# Patient Record
Sex: Female | Born: 1945 | Race: White | Hispanic: No
Health system: Midwestern US, Academic
[De-identification: ages and names within clinical notes are randomized; demographics above are authoritative.]

---

## 2017-10-14 LAB — CBC: Lab: 9

## 2017-10-14 LAB — LIPID PROFILE
Lab: 196 — ABNORMAL HIGH (ref <100)
Lab: 28 — ABNORMAL HIGH (ref 0.57–1.11)
Lab: 296 — ABNORMAL HIGH (ref 150–200)
Lab: 4
Lab: 73 — ABNORMAL HIGH (ref 35–60)

## 2017-10-14 LAB — COMPREHENSIVE METABOLIC PANEL
Lab: 138
Lab: 7.1

## 2017-10-14 LAB — THYROID STIMULATING HORMONE-TSH: Lab: 1.3

## 2017-11-02 ENCOUNTER — Encounter: Admit: 2017-11-02 | Discharge: 2017-11-02 | Payer: MEDICARE

## 2017-11-02 DIAGNOSIS — I10 Essential (primary) hypertension: Principal | ICD-10-CM

## 2017-11-14 ENCOUNTER — Encounter: Admit: 2017-11-14 | Discharge: 2017-11-14 | Payer: MEDICARE

## 2017-12-15 ENCOUNTER — Encounter: Admit: 2017-12-15 | Discharge: 2017-12-15 | Payer: MEDICARE

## 2017-12-16 ENCOUNTER — Ambulatory Visit: Admit: 2017-12-16 | Discharge: 2017-12-17 | Payer: MEDICARE

## 2017-12-16 ENCOUNTER — Encounter: Admit: 2017-12-16 | Discharge: 2017-12-16 | Payer: MEDICARE

## 2017-12-16 DIAGNOSIS — R0600 Dyspnea, unspecified: Principal | ICD-10-CM

## 2017-12-27 ENCOUNTER — Encounter: Admit: 2017-12-27 | Discharge: 2017-12-27 | Payer: MEDICARE

## 2017-12-27 DIAGNOSIS — G8929 Other chronic pain: ICD-10-CM

## 2017-12-27 DIAGNOSIS — G43909 Migraine, unspecified, not intractable, without status migrainosus: ICD-10-CM

## 2017-12-27 DIAGNOSIS — M549 Dorsalgia, unspecified: ICD-10-CM

## 2017-12-27 DIAGNOSIS — Z9114 Patient's other noncompliance with medication regimen: ICD-10-CM

## 2017-12-27 DIAGNOSIS — F319 Bipolar disorder, unspecified: ICD-10-CM

## 2017-12-27 DIAGNOSIS — E785 Hyperlipidemia, unspecified: Principal | ICD-10-CM

## 2017-12-27 DIAGNOSIS — K219 Gastro-esophageal reflux disease without esophagitis: ICD-10-CM

## 2017-12-27 DIAGNOSIS — R0789 Other chest pain: ICD-10-CM

## 2017-12-27 DIAGNOSIS — I6529 Occlusion and stenosis of unspecified carotid artery: ICD-10-CM

## 2017-12-27 DIAGNOSIS — I10 Essential (primary) hypertension: ICD-10-CM

## 2017-12-27 DIAGNOSIS — G14 Postpolio syndrome: ICD-10-CM

## 2017-12-27 NOTE — Progress Notes
Records Request    Medical records request for continuation of care:    Patient has appointment on 12/30/17   with  Dr. Nickolas MadridMarina Hannen* .    Please fax records to [facility]  [phone]    Request records:    Most recent hospital admission/ Emergency Room visit    Cardiac Catheterization    EKG's           Recent Cardiac Testing    Any cardiac-related records    Recent Labs    Procedures    H&P/Discharge Summary    Operative Reports- Cardiac      Thank you,      [facility]  [facility]  [address]  [place], [place] [id number]  Phone:  [phone]  Fax:  [phone]

## 2017-12-30 ENCOUNTER — Encounter: Admit: 2017-12-30 | Discharge: 2017-12-30 | Payer: MEDICARE

## 2017-12-30 ENCOUNTER — Ambulatory Visit: Admit: 2017-12-30 | Discharge: 2017-12-31 | Payer: MEDICARE

## 2017-12-30 DIAGNOSIS — I10 Essential (primary) hypertension: ICD-10-CM

## 2017-12-30 DIAGNOSIS — I6529 Occlusion and stenosis of unspecified carotid artery: ICD-10-CM

## 2017-12-30 DIAGNOSIS — E7800 Pure hypercholesterolemia, unspecified: ICD-10-CM

## 2017-12-30 DIAGNOSIS — N289 Disorder of kidney and ureter, unspecified: ICD-10-CM

## 2017-12-30 DIAGNOSIS — K219 Gastro-esophageal reflux disease without esophagitis: ICD-10-CM

## 2017-12-30 DIAGNOSIS — M549 Dorsalgia, unspecified: ICD-10-CM

## 2017-12-30 DIAGNOSIS — Z9114 Patient's other noncompliance with medication regimen: ICD-10-CM

## 2017-12-30 DIAGNOSIS — I350 Nonrheumatic aortic (valve) stenosis: ICD-10-CM

## 2017-12-30 DIAGNOSIS — F319 Bipolar disorder, unspecified: ICD-10-CM

## 2017-12-30 DIAGNOSIS — E785 Hyperlipidemia, unspecified: Principal | ICD-10-CM

## 2017-12-30 DIAGNOSIS — G43909 Migraine, unspecified, not intractable, without status migrainosus: ICD-10-CM

## 2017-12-30 DIAGNOSIS — G8929 Other chronic pain: ICD-10-CM

## 2017-12-30 DIAGNOSIS — G14 Postpolio syndrome: ICD-10-CM

## 2017-12-30 DIAGNOSIS — R0989 Other specified symptoms and signs involving the circulatory and respiratory systems: ICD-10-CM

## 2017-12-30 DIAGNOSIS — R0789 Other chest pain: ICD-10-CM

## 2017-12-30 DIAGNOSIS — I6522 Occlusion and stenosis of left carotid artery: Principal | ICD-10-CM

## 2018-01-06 ENCOUNTER — Encounter: Admit: 2018-01-06 | Discharge: 2018-01-06 | Payer: MEDICARE

## 2018-01-06 ENCOUNTER — Encounter: Admit: 2018-01-06 | Discharge: 2018-01-07 | Payer: MEDICARE

## 2018-01-07 ENCOUNTER — Encounter: Admit: 2018-01-07 | Discharge: 2018-01-08 | Payer: MEDICARE

## 2018-02-10 ENCOUNTER — Encounter: Admit: 2018-02-10 | Discharge: 2018-02-11 | Payer: MEDICARE

## 2018-08-09 ENCOUNTER — Encounter: Admit: 2018-08-09 | Discharge: 2018-08-09 | Payer: MEDICARE

## 2018-11-03 ENCOUNTER — Encounter: Admit: 2018-11-03 | Discharge: 2018-11-03 | Payer: MEDICARE

## 2018-11-03 DIAGNOSIS — M549 Dorsalgia, unspecified: ICD-10-CM

## 2018-11-03 DIAGNOSIS — G8929 Other chronic pain: ICD-10-CM

## 2018-11-03 DIAGNOSIS — E785 Hyperlipidemia, unspecified: Principal | ICD-10-CM

## 2018-11-03 DIAGNOSIS — R269 Unspecified abnormalities of gait and mobility: ICD-10-CM

## 2018-11-03 DIAGNOSIS — R0789 Other chest pain: ICD-10-CM

## 2018-11-03 DIAGNOSIS — G14 Postpolio syndrome: ICD-10-CM

## 2018-11-03 DIAGNOSIS — I10 Essential (primary) hypertension: ICD-10-CM

## 2018-11-03 DIAGNOSIS — Z9114 Patient's other noncompliance with medication regimen: ICD-10-CM

## 2018-11-03 DIAGNOSIS — F319 Bipolar disorder, unspecified: ICD-10-CM

## 2018-11-03 DIAGNOSIS — I6529 Occlusion and stenosis of unspecified carotid artery: ICD-10-CM

## 2018-11-03 DIAGNOSIS — G43909 Migraine, unspecified, not intractable, without status migrainosus: ICD-10-CM

## 2018-11-03 DIAGNOSIS — K219 Gastro-esophageal reflux disease without esophagitis: ICD-10-CM

## 2018-11-04 ENCOUNTER — Emergency Department
Admit: 2018-11-04 | Discharge: 2018-11-04 | Payer: MEDICARE | Attending: Student in an Organized Health Care Education/Training Program

## 2018-11-04 ENCOUNTER — Encounter: Admit: 2018-11-04 | Discharge: 2018-11-04 | Payer: MEDICARE

## 2018-11-04 DIAGNOSIS — K219 Gastro-esophageal reflux disease without esophagitis: ICD-10-CM

## 2018-11-04 DIAGNOSIS — Z9114 Patient's other noncompliance with medication regimen: ICD-10-CM

## 2018-11-04 DIAGNOSIS — G8929 Other chronic pain: ICD-10-CM

## 2018-11-04 DIAGNOSIS — I6529 Occlusion and stenosis of unspecified carotid artery: ICD-10-CM

## 2018-11-04 DIAGNOSIS — M549 Dorsalgia, unspecified: ICD-10-CM

## 2018-11-04 DIAGNOSIS — I10 Essential (primary) hypertension: ICD-10-CM

## 2018-11-04 DIAGNOSIS — F319 Bipolar disorder, unspecified: ICD-10-CM

## 2018-11-04 DIAGNOSIS — R0789 Other chest pain: ICD-10-CM

## 2018-11-04 DIAGNOSIS — G14 Postpolio syndrome: ICD-10-CM

## 2018-11-04 DIAGNOSIS — G43909 Migraine, unspecified, not intractable, without status migrainosus: ICD-10-CM

## 2018-11-04 DIAGNOSIS — E785 Hyperlipidemia, unspecified: Principal | ICD-10-CM

## 2018-11-04 LAB — COMPREHENSIVE METABOLIC PANEL
Lab: 140 MMOL/L — ABNORMAL LOW (ref 137–147)
Lab: 15 10*3/uL — ABNORMAL HIGH (ref 3–12)
Lab: 38 mL/min — ABNORMAL LOW (ref >60)
Lab: 46 mL/min — ABNORMAL LOW (ref >60)

## 2018-11-04 LAB — URINALYSIS DIPSTICK
Lab: NEGATIVE U/L (ref 7–40)
Lab: NEGATIVE U/L (ref 7–56)
Lab: NEGATIVE g/dL (ref 3.5–5.0)
Lab: NEGATIVE g/dL (ref 6.0–8.0)
Lab: NEGATIVE mg/dL (ref 8.5–10.6)
Lab: NEGATIVE mg/dL — ABNORMAL LOW (ref 0.3–1.2)

## 2018-11-04 LAB — URINALYSIS, MICROSCOPIC

## 2018-11-04 LAB — CBC AND DIFF
Lab: 0 10*3/uL (ref 0–0.45)
Lab: 0.1 10*3/uL (ref 0–0.20)
Lab: 15 10*3/uL — ABNORMAL HIGH (ref 4.5–11.0)
Lab: 0 % (ref 0–2)
Lab: 0 % (ref 0–5)
Lab: 0 10*3/uL (ref 0–0.20)
Lab: 0 10*3/uL (ref 0–0.45)
Lab: 0.8 10*3/uL (ref 0–0.80)
Lab: 13 10*3/uL — ABNORMAL HIGH (ref 4.5–11.0)
Lab: 2.9 10*3/uL (ref 1.0–4.8)
Lab: 3.6 M/UL — ABNORMAL LOW (ref 4.0–5.0)
Lab: 9.3 10*3/uL — ABNORMAL HIGH (ref 1.8–7.0)

## 2018-11-04 LAB — VITAMIN B12: Lab: 190 pg/mL (ref 180–914)

## 2018-11-04 LAB — BASIC METABOLIC PANEL
Lab: 1.2 mg/dL — ABNORMAL HIGH (ref 0.4–1.00)
Lab: 107 MMOL/L (ref 98–110)
Lab: 125 mg/dL — ABNORMAL HIGH (ref 70–100)
Lab: 13 g/dL — ABNORMAL HIGH (ref 3–12)
Lab: 140 MMOL/L — ABNORMAL LOW (ref 137–147)
Lab: 20 MMOL/L — ABNORMAL LOW (ref 21–30)
Lab: 22 mg/dL (ref 7–25)
Lab: 3.2 MMOL/L — ABNORMAL LOW (ref 3.5–5.1)
Lab: 43 mL/min — ABNORMAL LOW (ref >60)
Lab: 52 mL/min — ABNORMAL LOW (ref >60)
Lab: 9.1 mg/dL (ref 8.5–10.6)

## 2018-11-04 LAB — IRON + BINDING CAPACITY + %SAT+ FERRITIN
Lab: 13 % — ABNORMAL LOW (ref 28–42)
Lab: 31 ng/mL (ref 10–200)
Lab: 413 ug/dL — ABNORMAL HIGH (ref 270–380)
Lab: 54 ug/dL (ref 50–160)

## 2018-11-04 LAB — PHOSPHORUS: Lab: 3 mg/dL (ref 2.0–4.5)

## 2018-11-04 LAB — POC TROPONIN

## 2018-11-04 LAB — MAGNESIUM: Lab: 2.3 mg/dL (ref 1.6–2.6)

## 2018-11-04 LAB — TSH WITH FREE T4 REFLEX: Lab: 0.8 uU/mL — ABNORMAL HIGH (ref 0.35–5.00)

## 2018-11-04 MED ORDER — ALPRAZOLAM 1 MG PO TAB
1 mg | Freq: Once | ORAL | 0 refills | Status: CP
Start: 2018-11-04 — End: ?
  Administered 2018-11-05: 07:00:00 1 mg via ORAL

## 2018-11-04 MED ORDER — RIVAROXABAN 20 MG PO TAB
20 mg | Freq: Every day | ORAL | 0 refills | Status: DC
Start: 2018-11-04 — End: 2018-11-06
  Administered 2018-11-04: 23:00:00 20 mg via ORAL

## 2018-11-04 MED ORDER — PANTOPRAZOLE 40 MG PO TBEC
40 mg | Freq: Every day | ORAL | 0 refills | Status: DC
Start: 2018-11-04 — End: 2018-11-06
  Administered 2018-11-04 – 2018-11-05 (×2): 40 mg via ORAL

## 2018-11-04 MED ORDER — ASPIRIN 81 MG PO CHEW
81 mg | Freq: Every day | ORAL | 0 refills | Status: DC
Start: 2018-11-04 — End: 2018-11-04

## 2018-11-04 MED ORDER — METOPROLOL SUCCINATE 100 MG PO TB24
100 mg | Freq: Every evening | ORAL | 0 refills | Status: DC
Start: 2018-11-04 — End: 2018-11-06

## 2018-11-04 MED ORDER — LAMOTRIGINE 200 MG PO TAB
200 mg | Freq: Every day | ORAL | 0 refills | Status: DC
Start: 2018-11-04 — End: 2018-11-06
  Administered 2018-11-04 – 2018-11-05 (×2): 200 mg via ORAL

## 2018-11-04 MED ORDER — RIVAROXABAN 20 MG PO TAB
20 mg | ORAL_TABLET | Freq: Every day | ORAL | 0 refills | Status: CN
Start: 2018-11-04 — End: ?

## 2018-11-04 MED ORDER — HYDRALAZINE 20 MG/ML IJ SOLN
10 mg | INTRAVENOUS | 0 refills | Status: DC | PRN
Start: 2018-11-04 — End: 2018-11-06
  Administered 2018-11-05 (×2): 10 mg via INTRAVENOUS

## 2018-11-04 MED ORDER — LAMOTRIGINE 200 MG PO TAB
200 mg | Freq: Every day | ORAL | 0 refills | Status: CN
Start: 2018-11-04 — End: ?

## 2018-11-04 MED ORDER — ROPINIROLE 0.5 MG PO TAB
3 mg | Freq: Every evening | ORAL | 0 refills | Status: DC
Start: 2018-11-04 — End: 2018-11-06
  Administered 2018-11-05 (×2): 3 mg via ORAL

## 2018-11-04 MED ORDER — OXYCODONE 5 MG PO TAB
5 mg | Freq: Two times a day (BID) | ORAL | 0 refills | Status: DC | PRN
Start: 2018-11-04 — End: 2018-11-05
  Administered 2018-11-04 – 2018-11-05 (×2): 5 mg via ORAL

## 2018-11-04 MED ORDER — IMS MIXTURE TEMPLATE
0.3 mg | Freq: Every evening | ORAL | 0 refills | Status: DC
Start: 2018-11-04 — End: 2018-11-05
  Administered 2018-11-05: 05:00:00 0.3 mg via ORAL

## 2018-11-04 MED ORDER — TRAMADOL 50 MG PO TAB
50-100 mg | Freq: Three times a day (TID) | ORAL | 0 refills | Status: DC | PRN
Start: 2018-11-04 — End: 2018-11-06
  Administered 2018-11-04 – 2018-11-05 (×2): 50 mg via ORAL

## 2018-11-04 MED ORDER — CARBAMAZEPINE 200 MG PO CM12
200 mg | Freq: Two times a day (BID) | ORAL | 0 refills | Status: DC
Start: 2018-11-04 — End: 2018-11-04

## 2018-11-04 MED ORDER — CEFTRIAXONE INJ 1GM IVP
1 g | Freq: Once | INTRAVENOUS | 0 refills | Status: CP
Start: 2018-11-04 — End: ?
  Administered 2018-11-04: 09:00:00 1 g via INTRAVENOUS

## 2018-11-04 MED ORDER — CITALOPRAM 10 MG PO TAB
40 mg | Freq: Every day | ORAL | 0 refills | Status: DC
Start: 2018-11-04 — End: 2018-11-06
  Administered 2018-11-04 – 2018-11-05 (×2): 40 mg via ORAL

## 2018-11-04 MED ORDER — ROSUVASTATIN 20 MG PO TAB
20 mg | Freq: Every evening | ORAL | 0 refills | Status: DC
Start: 2018-11-04 — End: 2018-11-06

## 2018-11-04 MED ORDER — ALPRAZOLAM 0.5 MG PO TAB
1 mg | Freq: Every evening | ORAL | 0 refills | Status: DC | PRN
Start: 2018-11-04 — End: 2018-11-06
  Administered 2018-11-05: 05:00:00 1 mg via ORAL

## 2018-11-04 MED ORDER — ONDANSETRON HCL (PF) 4 MG/2 ML IJ SOLN
4 mg | INTRAVENOUS | 0 refills | Status: DC | PRN
Start: 2018-11-04 — End: 2018-11-06
  Administered 2018-11-05 (×2): 4 mg via INTRAVENOUS

## 2018-11-04 MED ORDER — ACETAMINOPHEN 500 MG PO TAB
1000 mg | Freq: Three times a day (TID) | ORAL | 0 refills | Status: DC
Start: 2018-11-04 — End: 2018-11-06
  Administered 2018-11-04: 22:00:00 1000 mg via ORAL

## 2018-11-04 MED ORDER — LACTATED RINGERS IV SOLP
500 mL | INTRAVENOUS | 0 refills | Status: CP
Start: 2018-11-04 — End: ?
  Administered 2018-11-04: 09:00:00 500 mL via INTRAVENOUS

## 2018-11-04 MED ORDER — ROPINIROLE 2 MG PO TAB
2 mg | Freq: Once | ORAL | 0 refills | Status: CP
Start: 2018-11-04 — End: ?
  Administered 2018-11-04: 22:00:00 2 mg via ORAL

## 2018-11-04 MED ORDER — ACETAMINOPHEN 500 MG PO TAB
1000 mg | Freq: Three times a day (TID) | ORAL | 0 refills | Status: CN
Start: 2018-11-04 — End: ?

## 2018-11-04 MED ORDER — CEFTRIAXONE INJ 1GM IVP
1 g | INTRAVENOUS | 0 refills | Status: DC
Start: 2018-11-04 — End: 2018-11-06
  Administered 2018-11-05: 12:00:00 1 g via INTRAVENOUS

## 2018-11-05 ENCOUNTER — Inpatient Hospital Stay: Admit: 2018-11-05 | Discharge: 2018-11-06 | Payer: MEDICARE

## 2018-11-05 ENCOUNTER — Inpatient Hospital Stay: Admit: 2018-11-04 | Discharge: 2018-11-04 | Payer: MEDICARE

## 2018-11-05 ENCOUNTER — Encounter: Admit: 2018-11-05 | Discharge: 2018-11-05 | Payer: MEDICARE

## 2018-11-05 ENCOUNTER — Inpatient Hospital Stay
Admit: 2018-11-04 | Discharge: 2018-11-05 | Disposition: A | Discharge status: Home / Self Care | Payer: MEDICARE | Attending: Student in an Organized Health Care Education/Training Program

## 2018-11-05 DIAGNOSIS — Z96653 Presence of artificial knee joint, bilateral: ICD-10-CM

## 2018-11-05 DIAGNOSIS — N183 Chronic kidney disease, stage 3 (moderate): ICD-10-CM

## 2018-11-05 DIAGNOSIS — M1611 Unilateral primary osteoarthritis, right hip: ICD-10-CM

## 2018-11-05 DIAGNOSIS — K219 Gastro-esophageal reflux disease without esophagitis: ICD-10-CM

## 2018-11-05 DIAGNOSIS — I6522 Occlusion and stenosis of left carotid artery: ICD-10-CM

## 2018-11-05 DIAGNOSIS — E538 Deficiency of other specified B group vitamins: Principal | ICD-10-CM

## 2018-11-05 DIAGNOSIS — D509 Iron deficiency anemia, unspecified: ICD-10-CM

## 2018-11-05 DIAGNOSIS — S62501A Fracture of unspecified phalanx of right thumb, initial encounter for closed fracture: ICD-10-CM

## 2018-11-05 DIAGNOSIS — R296 Repeated falls: ICD-10-CM

## 2018-11-05 DIAGNOSIS — Z79899 Other long term (current) drug therapy: ICD-10-CM

## 2018-11-05 DIAGNOSIS — N179 Acute kidney failure, unspecified: ICD-10-CM

## 2018-11-05 DIAGNOSIS — Z90710 Acquired absence of both cervix and uterus: ICD-10-CM

## 2018-11-05 DIAGNOSIS — E785 Hyperlipidemia, unspecified: ICD-10-CM

## 2018-11-05 DIAGNOSIS — N3000 Acute cystitis without hematuria: ICD-10-CM

## 2018-11-05 DIAGNOSIS — Z823 Family history of stroke: ICD-10-CM

## 2018-11-05 DIAGNOSIS — Z86711 Personal history of pulmonary embolism: ICD-10-CM

## 2018-11-05 DIAGNOSIS — F3181 Bipolar II disorder: ICD-10-CM

## 2018-11-05 DIAGNOSIS — D649 Anemia, unspecified: ICD-10-CM

## 2018-11-05 DIAGNOSIS — G4700 Insomnia, unspecified: ICD-10-CM

## 2018-11-05 DIAGNOSIS — Z87891 Personal history of nicotine dependence: ICD-10-CM

## 2018-11-05 DIAGNOSIS — G43909 Migraine, unspecified, not intractable, without status migrainosus: ICD-10-CM

## 2018-11-05 DIAGNOSIS — E568 Deficiency of other vitamins: ICD-10-CM

## 2018-11-05 DIAGNOSIS — G2581 Restless legs syndrome: ICD-10-CM

## 2018-11-05 DIAGNOSIS — I2510 Atherosclerotic heart disease of native coronary artery without angina pectoris: ICD-10-CM

## 2018-11-05 DIAGNOSIS — I129 Hypertensive chronic kidney disease with stage 1 through stage 4 chronic kidney disease, or unspecified chronic kidney disease: ICD-10-CM

## 2018-11-05 LAB — CULTURE-URINE W/SENSITIVITY: Lab: 3

## 2018-11-05 LAB — HOMOCYSTEINE: Lab: 14 umol/L (ref 5–20)

## 2018-11-05 LAB — CBC: Lab: 10 K/UL — ABNORMAL LOW (ref >60)

## 2018-11-05 LAB — CREATINE KINASE-CPK: Lab: 22 U/L (ref 21–215)

## 2018-11-05 MED ORDER — CYANOCOBALAMIN (VITAMIN B-12) 100 MCG PO TAB
100 ug | ORAL_TABLET | Freq: Every day | ORAL | 0 refills | 29.00000 days | Status: AC
Start: 2018-11-05 — End: 2018-11-05

## 2018-11-05 MED ORDER — FERROUS SULFATE 325 MG (65 MG IRON) PO TAB
325 mg | ORAL_TABLET | Freq: Three times a day (TID) | ORAL | 0 refills | Status: AC
Start: 2018-11-05 — End: ?

## 2018-11-05 MED ORDER — CYANOCOBALAMIN (VITAMIN B-12) 1,000 MCG/ML IJ SOLN
1000 ug | Freq: Once | INTRAMUSCULAR | 0 refills | Status: CP
Start: 2018-11-05 — End: ?
  Administered 2018-11-05: 18:00:00 1000 ug via INTRAMUSCULAR

## 2018-11-05 MED ORDER — CLONIDINE HCL 0.1 MG PO TAB
.3 mg | Freq: Two times a day (BID) | ORAL | 0 refills | Status: DC
Start: 2018-11-05 — End: 2018-11-06
  Administered 2018-11-05: 22:00:00 0.3 mg via ORAL

## 2018-11-05 MED ORDER — CLONIDINE HCL 0.3 MG PO TAB
0.3 mg | ORAL_TABLET | Freq: Two times a day (BID) | ORAL | 0 refills | Status: AC
Start: 2018-11-05 — End: ?

## 2018-11-05 MED ORDER — SYRINGE WITH NEEDLE 3 ML 22 X 1 1/2" MISC SYRG
0 refills | 28.00000 days | Status: AC
Start: 2018-11-05 — End: ?

## 2018-11-05 MED ORDER — SODIUM CHLORIDE 0.9 % IJ SOLN
50 mL | Freq: Once | INTRAVENOUS | 0 refills | Status: CP
Start: 2018-11-05 — End: ?
  Administered 2018-11-05: 15:00:00 50 mL via INTRAVENOUS

## 2018-11-05 MED ORDER — RIVAROXABAN 20 MG PO TAB
20 mg | ORAL_TABLET | Freq: Every day | ORAL | 0 refills | 30.00000 days | Status: AC
Start: 2018-11-05 — End: ?

## 2018-11-05 MED ORDER — LAMOTRIGINE 200 MG PO TAB
200 mg | ORAL_TABLET | Freq: Every day | ORAL | 0 refills | Status: AC
Start: 2018-11-05 — End: ?

## 2018-11-05 MED ORDER — SUMATRIPTAN SUCCINATE 50 MG PO TAB
50 mg | Freq: Once | ORAL | 0 refills | Status: CP
Start: 2018-11-05 — End: ?
  Administered 2018-11-05: 16:00:00 50 mg via ORAL

## 2018-11-05 MED ORDER — PROMETHAZINE 25 MG/ML IJ SOLN
12.5 mg | Freq: Once | INTRAVENOUS | 0 refills | Status: CP
Start: 2018-11-05 — End: ?
  Administered 2018-11-05: 22:00:00 12.5 mg via INTRAVENOUS

## 2018-11-05 MED ORDER — CYANOCOBALAMIN (VITAMIN B-12) 1,000 MCG/ML IJ SOLN
1 mL | INTRAMUSCULAR | 0 refills | 29.00000 days | Status: AC
Start: 2018-11-05 — End: ?

## 2018-11-05 MED ORDER — FERROUS SULFATE 325 MG (65 MG IRON) PO TAB
325 mg | Freq: Three times a day (TID) | ORAL | 0 refills | Status: DC
Start: 2018-11-05 — End: 2018-11-06
  Administered 2018-11-05 (×2): 325 mg via ORAL

## 2018-11-05 MED ORDER — IOHEXOL 350 MG IODINE/ML IV SOLN
65 mL | Freq: Once | INTRAVENOUS | 0 refills | Status: CP
Start: 2018-11-05 — End: ?
  Administered 2018-11-05: 15:00:00 65 mL via INTRAVENOUS

## 2018-11-07 LAB — INTRINSIC FACTOR BLOCKING AB: Lab: POSITIVE

## 2018-11-08 LAB — METHYLMALONIC ACID QUANT: Lab: 0.3

## 2018-11-09 LAB — ANTI-PARIETAL CELL ANTIBODY: Lab: 140 — ABNORMAL HIGH

## 2018-11-20 ENCOUNTER — Encounter: Admit: 2018-11-20 | Discharge: 2018-11-21 | Payer: MEDICARE

## 2018-12-07 ENCOUNTER — Encounter: Admit: 2018-12-07 | Discharge: 2018-12-08 | Payer: MEDICARE

## 2018-12-07 ENCOUNTER — Encounter: Admit: 2018-12-07 | Discharge: 2018-12-07 | Payer: MEDICARE

## 2019-01-17 ENCOUNTER — Ambulatory Visit: Admit: 2019-01-17 | Discharge: 2019-01-18 | Payer: MEDICARE

## 2019-01-17 ENCOUNTER — Encounter: Admit: 2019-01-17 | Discharge: 2019-01-17 | Payer: MEDICARE

## 2019-01-17 DIAGNOSIS — Z9114 Patient's other noncompliance with medication regimen: Secondary | ICD-10-CM

## 2019-01-17 DIAGNOSIS — R0789 Other chest pain: Secondary | ICD-10-CM

## 2019-01-17 DIAGNOSIS — I6529 Occlusion and stenosis of unspecified carotid artery: Secondary | ICD-10-CM

## 2019-01-17 DIAGNOSIS — G8929 Other chronic pain: Secondary | ICD-10-CM

## 2019-01-17 DIAGNOSIS — G43909 Migraine, unspecified, not intractable, without status migrainosus: Secondary | ICD-10-CM

## 2019-01-17 DIAGNOSIS — K219 Gastro-esophageal reflux disease without esophagitis: Secondary | ICD-10-CM

## 2019-01-17 DIAGNOSIS — I10 Essential (primary) hypertension: Secondary | ICD-10-CM

## 2019-01-17 DIAGNOSIS — F319 Bipolar disorder, unspecified: Secondary | ICD-10-CM

## 2019-01-17 DIAGNOSIS — E785 Hyperlipidemia, unspecified: Secondary | ICD-10-CM

## 2019-01-17 DIAGNOSIS — M549 Dorsalgia, unspecified: Secondary | ICD-10-CM

## 2019-01-17 DIAGNOSIS — G14 Postpolio syndrome: Secondary | ICD-10-CM

## 2019-01-18 DIAGNOSIS — R296 Repeated falls: Secondary | ICD-10-CM

## 2019-01-18 DIAGNOSIS — E568 Deficiency of other vitamins: Secondary | ICD-10-CM

## 2019-02-01 ENCOUNTER — Encounter: Admit: 2019-02-01 | Discharge: 2019-02-01 | Payer: MEDICARE

## 2019-02-01 MED ORDER — CYANOCOBALAMIN (VITAMIN B-12) 1,000 MCG/ML IJ SOLN
0 refills
Start: 2019-02-01 — End: ?

## 2019-02-17 ENCOUNTER — Encounter: Admit: 2019-02-17 | Discharge: 2019-02-17 | Payer: MEDICARE

## 2019-02-23 ENCOUNTER — Encounter: Admit: 2019-02-23 | Discharge: 2019-02-23 | Payer: MEDICARE

## 2019-05-08 ENCOUNTER — Encounter: Admit: 2019-05-08 | Discharge: 2019-05-09 | Payer: MEDICARE

## 2019-06-20 ENCOUNTER — Encounter: Admit: 2019-06-20 | Discharge: 2019-06-20

## 2019-07-13 ENCOUNTER — Encounter: Admit: 2019-07-13 | Discharge: 2019-07-13

## 2019-07-14 ENCOUNTER — Encounter: Admit: 2019-07-14 | Discharge: 2019-07-14

## 2019-07-17 ENCOUNTER — Encounter: Admit: 2019-07-17 | Discharge: 2019-07-17

## 2019-07-18 ENCOUNTER — Encounter: Admit: 2019-07-18 | Discharge: 2019-07-18

## 2019-07-18 ENCOUNTER — Ambulatory Visit: Admit: 2019-07-18 | Discharge: 2019-07-18

## 2019-07-18 DIAGNOSIS — E538 Deficiency of other specified B group vitamins: Principal | ICD-10-CM

## 2019-07-18 DIAGNOSIS — F319 Bipolar disorder, unspecified: Secondary | ICD-10-CM

## 2019-07-18 DIAGNOSIS — K219 Gastro-esophageal reflux disease without esophagitis: Secondary | ICD-10-CM

## 2019-07-18 DIAGNOSIS — M549 Dorsalgia, unspecified: Secondary | ICD-10-CM

## 2019-07-18 DIAGNOSIS — I6529 Occlusion and stenosis of unspecified carotid artery: Secondary | ICD-10-CM

## 2019-07-18 DIAGNOSIS — R479 Unspecified speech disturbances: Secondary | ICD-10-CM

## 2019-07-18 DIAGNOSIS — I10 Essential (primary) hypertension: Secondary | ICD-10-CM

## 2019-07-18 DIAGNOSIS — Z9114 Patient's other noncompliance with medication regimen: Secondary | ICD-10-CM

## 2019-07-18 DIAGNOSIS — R413 Other amnesia: Secondary | ICD-10-CM

## 2019-07-18 DIAGNOSIS — G8929 Other chronic pain: Secondary | ICD-10-CM

## 2019-07-18 DIAGNOSIS — G43909 Migraine, unspecified, not intractable, without status migrainosus: Secondary | ICD-10-CM

## 2019-07-18 DIAGNOSIS — R0789 Other chest pain: Secondary | ICD-10-CM

## 2019-07-18 DIAGNOSIS — G14 Postpolio syndrome: Secondary | ICD-10-CM

## 2019-07-18 DIAGNOSIS — E785 Hyperlipidemia, unspecified: Secondary | ICD-10-CM

## 2019-07-18 LAB — VITAMIN B12: Lab: 728 pg/mL — ABNORMAL HIGH (ref 180–914)

## 2019-07-18 MED ORDER — CYANOCOBALAMIN (VITAMIN B-12) 1,000 MCG/ML IJ SOLN
1000 ug | INTRAMUSCULAR | 0 refills | Status: AC
Start: 2019-07-18 — End: ?

## 2019-07-18 NOTE — Patient Instructions
Cheyenne Ayers 73 y.o. female with history of HTN, HLD, bipolar illness, and migraine who presented to neurology for evaluation of speech abnormality.     1. Vitamin B12 deficiency    2. Difficulty with speech    3. Short-term memory loss      RECOMMENDATIONS  - Check vitamin B12 level for further evaluation.   - Continue vitamin B12 supplementation, patient has noticed symptoms getting worse.   - Patient was doing well until vitamin B12 supplementation was decreased to monthly injection.    - Advised physical exercise and encouraged socialization with family with COVID precautions.      Orders Placed This Encounter    EMG-SCAN    VITAMIN B12 today     FOLLOWUP PLAN  Return in about 6 months (around 01/18/2020).  The patient is instructed to contact me if there are any concerns with the agreed plan.  Problem   Difficulty With Speech   Short-Term Memory Loss      Total time 45 minutes.  Estimated counseling time was more than 50% of the visit time. Counseled patient regarding vitamin B12 deficiency.

## 2019-07-18 NOTE — Progress Notes
Date of Service: 07/18/2019    Subjective:             Cheyenne Ayers is a 73 y.o. female with vitamin B12 deficiency.     History of Present Illness  Cheyenne Ayers 73 y.o. female with history of HTN, HLD, bipolar illness, and migraine who presented to neurology for evaluation.     Patient was initially seen on 12/2018 for EMG for post polio evaluation. EMG was normal without large fiber neuropathy or chronic reinnervation suggestive of postpolio syndrome. Patient presents to neurology for evaluation. She was diagnosed with vitamin B12 and was on weekly injection until 4 month ago when she was reduced to monthly injection. She feels like the word finding difficulty has gotten worse overtime. She has noticed short term memory issues since decreasing the vitamin B12. She feels like its due to decreasing the vitamin B12 supplementation. She denies numbness tingling or neuropathy as well. She has been on mood medication that improved migraine headache.  She has been doing her work without much issues. She is a retired Warden/ranger. She denies any family history of dementia.     Review of Records:     Medical History:   Diagnosis Date   ??? Bipolar disorder (HCC) 12/18/2011   ??? Carotid stenosis 10/30/2011   ??? Chest pressure 07/31/2011   ??? Chronic back pain 07/31/2011   ??? Chronic pain 07/31/2011   ??? GERD (gastroesophageal reflux disease) 07/31/2011   ??? HTN (hypertension) 07/31/2011   ??? Hyperlipidemia 07/31/2011   ??? Migraine 07/31/2011   ??? Non compliance w medication regimen 07/31/2011   ??? 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 HX APPENDECTOMY     ??? HX HYSTERECTOMY     ??? HX JOINT REPLACEMENT     ??? HX ROTATOR CUFF REPAIR      RIGHT        Social History     Socioeconomic History   ??? Marital status: Widowed     Spouse name: Not on file   ??? Number of children: 1   ??? Years of education: Not on file   ??? Highest education level: Not on file   Occupational History   ??? Not on file   Tobacco Use ??? Smoking status: Former Smoker     Last attempt to quit: 10/29/1984     Years since quitting: 34.7   ??? Smokeless tobacco: Never Used   Substance and Sexual Activity   ??? Alcohol use: Not Currently     Comment: rarely   ??? Drug use: No   ??? Sexual activity: Not on file   Other Topics Concern   ??? Not on file   Social History Narrative   ??? Not on file       Family History   Problem Relation Age of Onset   ??? Cancer Mother    ??? Stroke Father        Allergies   Allergen Reactions   ??? Calcium Channel Blocking Agent Diltiazem Analogues ANAPHYLAXIS   ??? Nitrous Oxide SHORTNESS OF BREATH   ??? Sulfa Dyne EDEMA   ??? Ambien [Zolpidem] SEE COMMENTS     Memory loss   ??? Halcion [Triazolam] SEE COMMENTS     Memory loss   ??? Lincocin [Lincomycin] UNKNOWN   ??? Stadol [Butorphanol Tartrate] NAUSEA AND VOMITING        Review of Systems   Neurological: Positive  for speech difficulty.     Objective:         ??? aspirin 81 mg chewable tablet Take 1 Tab by mouth daily.   ??? baclofen (LIORESAL) 20 mg tablet Take 20 mg by mouth at bedtime daily.   ??? citalopram (CELEXA) 40 mg tablet Take 40 mg by mouth daily.   ??? cloNIDine (CATAPRESS) 0.3 mg tablet Take one tablet by mouth twice daily.   ??? ferrous sulfate (FEOSOL) 325 mg (65 mg iron) tablet Take one tablet by mouth three times daily with meals. Take on an empty stomach at least 1 hour before or 2 hours after food.   ??? fexofenadine(+) (ALLEGRA) 180 mg tablet Take 180 mg by mouth daily.     ??? lamoTRIgine (LAMICTAL) 200 mg tablet Take one tablet by mouth daily.   ??? losartan (COZAAR) 50 mg tablet Take 1 Tab by mouth daily.   ??? MELATONIN PO Take 20 mg by mouth at bedtime daily.   ??? metoprolol XL (TOPROL XL) 100 mg extended release tablet Take 100 mg by mouth at bedtime daily.   ??? omeprazole DR(+) (PRILOSEC) 40 mg capsule Take 40 mg by mouth daily before breakfast.   ??? QUEtiapine (SEROQUEL) 25 mg tablet Take 25 mg by mouth at bedtime daily. ??? rivaroxaban (XARELTO) 20 mg tablet Take one tablet by mouth daily with dinner.   ??? rOPINIRole (REQUIP) 1 mg tablet Take 3 mg by mouth at bedtime daily.   ??? rosuvastatin (CRESTOR) 10 mg tablet Take 10 mg by mouth at bedtime daily.   ??? Syringe with Needle (Disp) (BD SAFETYGLIDE SYRINGE) 3 mL 22 x 1 1/2 syrg Use for cyanocobalamin injections intramuscularly qweek   ??? traMADol (ULTRAM) 50 mg tablet Take 50 mg by mouth as Needed for Pain.   ??? traZODone (DESYREL) 100 mg tablet Take 300 mg by mouth at bedtime daily.     Vitals:    07/18/19 1307   BP: 127/68   BP Source: Arm, Left Upper   Patient Position: Sitting   Pulse: 67   Weight: 69.7 kg (153 lb 9.6 oz)   Height: 157.5 cm (62)     Body mass index is 28.09 kg/m???.     Physical Exam  General: no acute distress, cooperative    HEENT: normocephalic, atraumatic  CV: well perfused  Pulm: equal chest rise, non labored breathing  Ext: no swelling, cyanosis and clubbing     Neurological Exam   Mental status: Alert and oriented to time, place, person and situation. Remembered 3/3 objects.  Speech: Fluent, with normal naming, comprehension, articulation and repetition  CN II-XII: Visual fields intact to confrontation, PERRL (4->2), EOMI, facial sensation intact. Symmetrical facial movement. Hearing grossly intact. Strong cough, elevates palate, uvula midline. Strong shoulder shrug. Tongue midline.  Motor: (R/L) b/l UE/LE 5/5 in proximal and distal muscle groups. Normal tone and bulk. No abnormal movement, fasciculation or pronator drift.  Sensory: intact light touch  Reflexes: (R/L) 2/2Bj, 2/2Trj, 2/2Brj, 2/2Knj, 2/2Aj.   Coordination/ fine movement: normal finger to nose.  Gait: normal stance and steady gait.        Assessment and Plan:  Cheyenne Ayers 73 y.o. female with history of HTN, HLD, bipolar illness, and migraine who presented to neurology for evaluation of speech abnormality and mild short term memory impairment.     1. Vitamin B12 deficiency 2. Difficulty with speech    3. Short-term memory loss      RECOMMENDATIONS  - Check vitamin B12  level for further evaluation.   - Continue vitamin B12 supplementation, patient has noticed symptoms getting worse.   - Patient was doing well until vitamin B12 supplementation was decreased to monthly injection.    - Advised physical exercise and encouraged socialization with family with COVID precautions.      Orders Placed This Encounter   ??? EMG-SCAN   ??? VITAMIN B12 today   ??? cyanocobalamin (RUBRAMIN) injection 1,000 mcg     FOLLOWUP PLAN  Return in about 6 months (around 01/18/2020).  The patient is instructed to contact me if there are any concerns with the agreed plan.  Problem   Difficulty With Speech   Short-Term Memory Loss      Total time 45 minutes.  Estimated counseling time was more than 50% of the visit time. Counseled patient regarding vitamin B12 deficiency.       Linden Dolin, MD  Clinical Assistant Professor   [facility]

## 2019-07-20 ENCOUNTER — Encounter: Admit: 2019-07-20 | Discharge: 2019-07-20

## 2019-07-21 ENCOUNTER — Encounter: Admit: 2019-07-21 | Discharge: 2019-07-21

## 2019-07-28 ENCOUNTER — Encounter: Admit: 2019-07-28 | Discharge: 2019-07-28

## 2019-09-13 ENCOUNTER — Encounter: Admit: 2019-09-13 | Discharge: 2019-09-13 | Payer: MEDICARE

## 2019-09-13 NOTE — Telephone Encounter
Received a call from Dr. Earvin Hansen in the [facility] clinic. This is a mutual pt. Pt has contact his office requesting B12 injections weekly. States her B12 levels are to high.  Requesting a call from Dr. Gray Bernhardt. Contact numbers [phone] (cell and preferred number) and  [phone].

## 2020-01-18 ENCOUNTER — Encounter: Admit: 2020-01-18 | Discharge: 2020-01-18 | Payer: MEDICARE

## 2022-01-27 ENCOUNTER — Encounter: Admit: 2022-01-27 | Discharge: 2022-01-27 | Payer: MEDICARE

## 2022-01-27 ENCOUNTER — Ambulatory Visit: Admit: 2022-01-27 | Discharge: 2022-01-27 | Payer: MEDICARE

## 2022-01-27 VITALS — BP 144/78 | Wt 139.4 lb

## 2022-01-27 DIAGNOSIS — R0789 Other chest pain: Secondary | ICD-10-CM

## 2022-01-27 DIAGNOSIS — F319 Bipolar disorder, unspecified: Secondary | ICD-10-CM

## 2022-01-27 DIAGNOSIS — G8929 Other chronic pain: Secondary | ICD-10-CM

## 2022-01-27 DIAGNOSIS — G14 Postpolio syndrome: Secondary | ICD-10-CM

## 2022-01-27 DIAGNOSIS — K219 Gastro-esophageal reflux disease without esophagitis: Secondary | ICD-10-CM

## 2022-01-27 DIAGNOSIS — I10 Essential (primary) hypertension: Secondary | ICD-10-CM

## 2022-01-27 DIAGNOSIS — I6529 Occlusion and stenosis of unspecified carotid artery: Secondary | ICD-10-CM

## 2022-01-27 DIAGNOSIS — M549 Dorsalgia, unspecified: Secondary | ICD-10-CM

## 2022-01-27 DIAGNOSIS — R9082 White matter disease, unspecified: Secondary | ICD-10-CM

## 2022-01-27 DIAGNOSIS — G43909 Migraine, unspecified, not intractable, without status migrainosus: Secondary | ICD-10-CM

## 2022-01-27 DIAGNOSIS — Z9114 Patient's other noncompliance with medication regimen: Secondary | ICD-10-CM

## 2022-01-27 DIAGNOSIS — E785 Hyperlipidemia, unspecified: Secondary | ICD-10-CM

## 2022-01-27 DIAGNOSIS — R413 Other amnesia: Principal | ICD-10-CM

## 2022-01-28 ENCOUNTER — Encounter: Admit: 2022-01-28 | Discharge: 2022-01-28 | Payer: MEDICARE

## 2022-01-28 DIAGNOSIS — G43909 Migraine, unspecified, not intractable, without status migrainosus: Secondary | ICD-10-CM

## 2022-01-28 DIAGNOSIS — I10 Essential (primary) hypertension: Secondary | ICD-10-CM

## 2022-01-28 DIAGNOSIS — K219 Gastro-esophageal reflux disease without esophagitis: Secondary | ICD-10-CM

## 2022-01-28 DIAGNOSIS — M549 Dorsalgia, unspecified: Secondary | ICD-10-CM

## 2022-01-28 DIAGNOSIS — G14 Postpolio syndrome: Secondary | ICD-10-CM

## 2022-01-28 DIAGNOSIS — F319 Bipolar disorder, unspecified: Secondary | ICD-10-CM

## 2022-01-28 DIAGNOSIS — E785 Hyperlipidemia, unspecified: Secondary | ICD-10-CM

## 2022-01-28 DIAGNOSIS — R0789 Other chest pain: Secondary | ICD-10-CM

## 2022-01-28 DIAGNOSIS — G8929 Other chronic pain: Secondary | ICD-10-CM

## 2022-01-28 DIAGNOSIS — Z9114 Patient's other noncompliance with medication regimen: Secondary | ICD-10-CM

## 2022-01-28 DIAGNOSIS — I6529 Occlusion and stenosis of unspecified carotid artery: Secondary | ICD-10-CM

## 2022-01-29 ENCOUNTER — Encounter: Admit: 2022-01-29 | Discharge: 2022-01-29 | Payer: MEDICARE

## 2022-03-09 ENCOUNTER — Encounter: Admit: 2022-03-09 | Discharge: 2022-03-09 | Payer: MEDICARE

## 2022-05-13 ENCOUNTER — Encounter: Admit: 2022-05-13 | Discharge: 2022-05-13 | Payer: MEDICARE

## 2022-05-18 ENCOUNTER — Encounter: Admit: 2022-05-18 | Discharge: 2022-05-18 | Payer: MEDICARE

## 2022-05-28 ENCOUNTER — Encounter: Admit: 2022-05-28 | Discharge: 2022-05-28 | Payer: MEDICARE

## 2022-06-17 ENCOUNTER — Encounter: Admit: 2022-06-17 | Discharge: 2022-06-17 | Payer: MEDICARE

## 2022-06-29 ENCOUNTER — Encounter: Admit: 2022-06-29 | Discharge: 2022-06-29 | Payer: MEDICARE

## 2022-07-06 ENCOUNTER — Encounter: Admit: 2022-07-06 | Discharge: 2022-07-06 | Payer: MEDICARE

## 2022-07-09 ENCOUNTER — Encounter: Admit: 2022-07-09 | Discharge: 2022-07-09 | Payer: MEDICARE

## 2022-07-15 ENCOUNTER — Encounter: Admit: 2022-07-15 | Discharge: 2022-07-15 | Payer: MEDICARE

## 2022-07-15 NOTE — Telephone Encounter
Lvm to complete paperwork prior to the appointment Or arrive 30 mins early to complete ppw. Also bring any recent images outside of [facility].  If arrive late and paperwork is not completed you will be asked to be rescheduled.

## 2022-07-16 ENCOUNTER — Encounter: Admit: 2022-07-16 | Discharge: 2022-07-16 | Payer: MEDICARE

## 2022-07-16 ENCOUNTER — Ambulatory Visit: Admit: 2022-07-16 | Discharge: 2022-07-16 | Payer: MEDICARE

## 2022-07-16 DIAGNOSIS — M5414 Radiculopathy, thoracic region: Secondary | ICD-10-CM

## 2022-07-16 DIAGNOSIS — G43909 Migraine, unspecified, not intractable, without status migrainosus: Secondary | ICD-10-CM

## 2022-07-16 DIAGNOSIS — M5124 Other intervertebral disc displacement, thoracic region: Secondary | ICD-10-CM

## 2022-07-16 DIAGNOSIS — I6529 Occlusion and stenosis of unspecified carotid artery: Secondary | ICD-10-CM

## 2022-07-16 DIAGNOSIS — G8929 Other chronic pain: Secondary | ICD-10-CM

## 2022-07-16 DIAGNOSIS — F319 Bipolar disorder, unspecified: Secondary | ICD-10-CM

## 2022-07-16 DIAGNOSIS — I10 Essential (primary) hypertension: Secondary | ICD-10-CM

## 2022-07-16 DIAGNOSIS — Z9114 Patient's other noncompliance with medication regimen: Secondary | ICD-10-CM

## 2022-07-16 DIAGNOSIS — K219 Gastro-esophageal reflux disease without esophagitis: Secondary | ICD-10-CM

## 2022-07-16 DIAGNOSIS — R0789 Other chest pain: Secondary | ICD-10-CM

## 2022-07-16 DIAGNOSIS — M549 Dorsalgia, unspecified: Secondary | ICD-10-CM

## 2022-07-16 DIAGNOSIS — M4724 Other spondylosis with radiculopathy, thoracic region: Secondary | ICD-10-CM

## 2022-07-16 DIAGNOSIS — G14 Postpolio syndrome: Secondary | ICD-10-CM

## 2022-07-16 DIAGNOSIS — E785 Hyperlipidemia, unspecified: Secondary | ICD-10-CM

## 2022-07-16 NOTE — Patient Instructions
Transforaminal Epidural Injection: Your Procedure  A transforminal epidural injection is an outpatient procedure. It?s often done in a hospital or an outpatient surgery center. Before your shot, your healthcare provider will tell you how to get ready.    Getting ready  You may need to do the following:  Give the healthcare provider a list of all medicines you take, such as aspirin, warfarin, and anti-inflammatories. (You may need to stop taking some of them before the injection.)  You may eat and drink prior to your procedure.  You do not have to have a driver, unless you would like one.    During the procedure  The injection takes just a few minutes. But extra time is needed to get ready.   In some cases, monitoring devices may be attached to your chest or side. These devices measure your heart rate, breathing, and blood pressure.  You lie on your stomach or side, depending on where the shot will be given. Your back is cleaned and may be covered with sterile towels.  Medicine is given to numb the skin near the place of the shot.  If X-ray imaging (fluoroscopy) is to be used, a contrast ?dye? may be injected into your back. This helps your healthcare provider get a better image.  A local anesthetic (for numbing), steroids (for reducing inflammation), or both are injected into the nerve root to calm inflammation.  The procedure is very safe. But there is a very small risk of infection or local reaction afterward. Seek medical care right away if you have:  Increasing pain  Headaches (especially when standing up)  Redness  Fever  Symptoms of infection    After the procedure  You?ll spend a short amount of time in a recovery area after the procedure. You may find that as the local anesthetic wears of, your pain gets worse before it gets better. It could take up to 10 days for the injection to give you pain relief. When released from the recovery area:    You may drive yourself home.  Go directly home and rest. Avoid strenuous activity for the next 24 hours.  You may resume your regular activities and exercise tomorrow.  You may experience soreness at the injection site today.  Apply ice at 20 minute intervals (up to 5 times per day) for the next 24 hours.  Do not use heat.  Take medications, including pain medications, as directed.   Patients taking a daily blood thinner can resume their regular dose this evening.  Care of your dressing: May remove band-aids in 24 hrs.  You may shower tomorrow.  Do not submerge the site in water for one week (includes baths, hot tubs, swimming).  Possible side effects to steroids that may occur:  Flushing or redness of the face  Irritability  Fluid retention  Change in women's menses  Please call the nurse line if you experience any of the following:  Any swelling, persistent redness, new bleeding, or drainage from the injection site(s).  Severe headache  Fever > 1010F  New onset of sharp, severe neck or back pain  New onset of extremity numbness or weakness  New difficulty controlling bowel or bladder function  New shortness of breath   Patients with diabetes may see an elevation in blood sugars for 7-10 days after the injection.  It is important to pay close attention to your diet, check your blood sugars daily, and report extreme elevations to the physician that treats your diabetes.  On holidays, weekends, or if after 4:00pm, please call [phone] and ask for the resident on call. If you are unable to keep your upcoming appointment, please notify the [facility] scheduler at [phone] at least 24 hours in advance.          Date Last Reviewed: 05/28/2017  ? 2000-2018 [employer], [place]. [address] [place], [place] [address]. All rights reserved. This information is not intended as a substitute for professional medical care. Always follow your healthcare professional's instructions. It was nice to see you today. Thank you for choosing to visit our clinic. Your time is important and if you had to wait today, we do apologize. Our goal is to run exactly on time; however, on occasion, we get behind in clinic due to unexpected patient issues. Thank you for your patience.    General Instructions:  How to reach me: Please send a MyChart message to the [facility] or leave a voicemail fat [phone]  How to get a medication refill: Please use the MyChart Refill request or contact your pharmacy directly to request medication refills. We do not do same day refills on controlled substances.  How to receive your test results: If you have signed up for MyChart, you will receive your test results and messages from me this way. Otherwise, you will get a phone call or letter. If you are expecting results and have not heard from my office within 2 weeks of your testing, please send a MyChart message or call my office.  Scheduling: Our scheduling phone number is [phone].  Support for many chronic illnesses is available through [employer]: [employer] or [phone].  For questions on nights, weekends or holidays, call the Operator at [phone], and ask for the doctor on call for Anesthesia Pain.      Again, thank you for coming in today.

## 2022-07-22 ENCOUNTER — Encounter: Admit: 2022-07-22 | Discharge: 2022-07-22 | Payer: MEDICARE

## 2022-12-04 IMAGING — MR SPLUMBWO
9 of 12 series · 28 of 48 positions shown · non-contrast
Comparison: none

[Series 5: T2 · sagittal · 4.0mm · 0.68mm/px · 2 of 17 slices shown (1 of 4)]
[im 1/17]
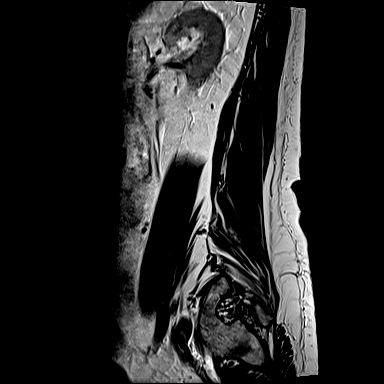
[im 17/17]
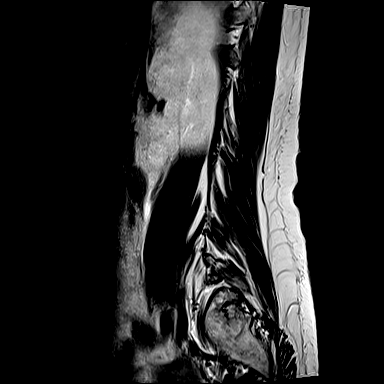

[Series 6: T1 · sagittal · 4.0mm · 0.81mm/px · 2 of 17 slices shown (1 of 4)]
[im 1/17]
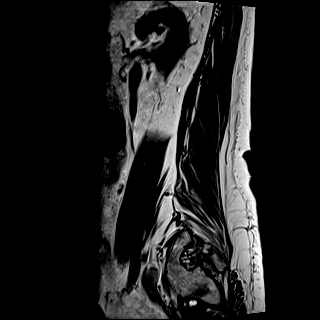
[im 17/17]
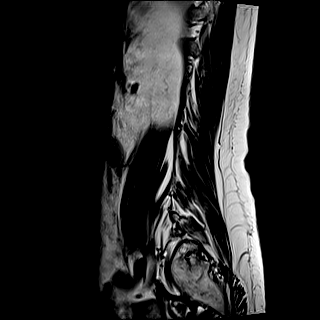

[Series 7: STIR · sagittal · 4.0mm · 0.51mm/px · 2 of 17 slices shown]
[im 1/17]
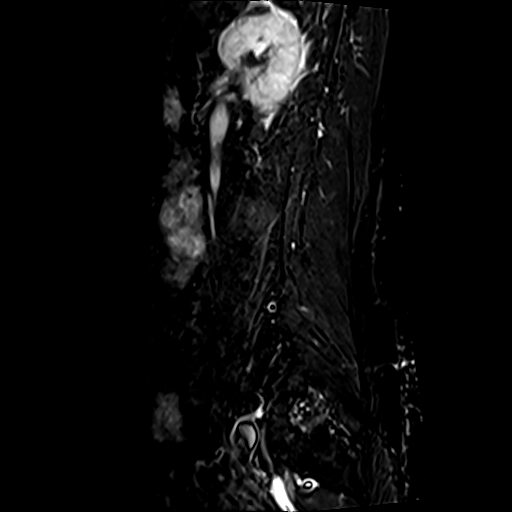
[im 17/17]
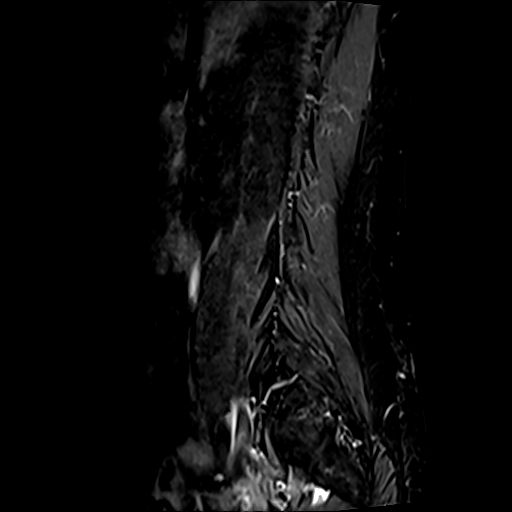

[Series 8: T2 · axial · 4.5mm · 0.81mm/px · z∈[+14,+138]mm · 4 of 24 slices shown (2 of 4)]
[im 1/24]
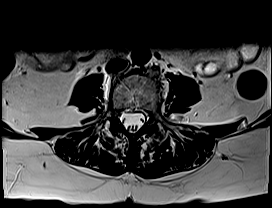
[im 8/24]
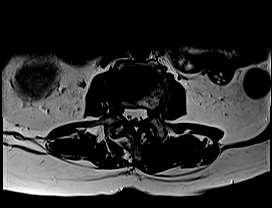
[im 16/24]
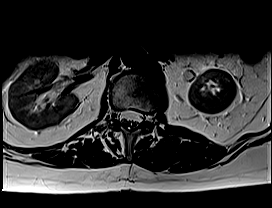
[im 24/24]
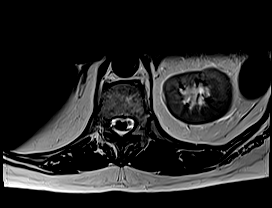

[Series 9: T2 · axial · 4.5mm · 0.81mm/px · z∈[-89,-35]mm · 2 of 13 slices shown (3 of 4)]
[im 1/13]
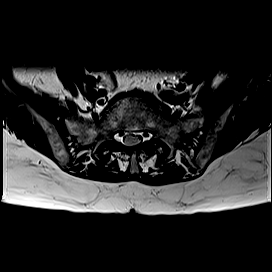
[im 13/13]
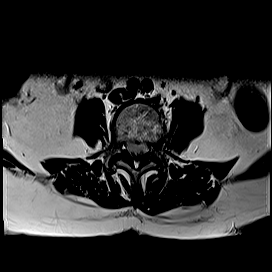

[Series 10: T2 · axial · 4.5mm · 0.81mm/px · z∈[-89,+138]mm · 5 of 35 slices shown (4 of 4)]
[im 1/35]
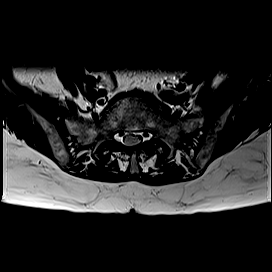
[im 9/35]
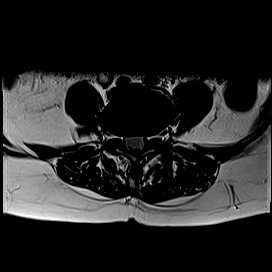
[im 18/35]
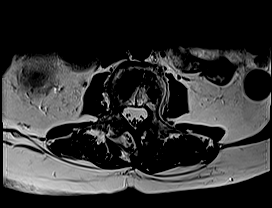
[im 26/35]
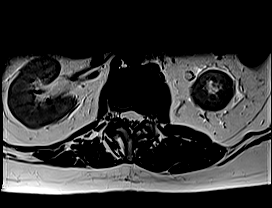
[im 35/35]
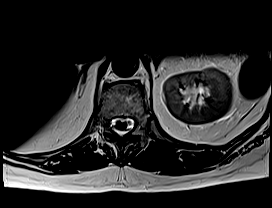

[Series 11: T1 · axial · 4.5mm · 0.43mm/px · z∈[+14,+138]mm · 4 of 24 slices shown (2 of 4)]
[im 1/24]
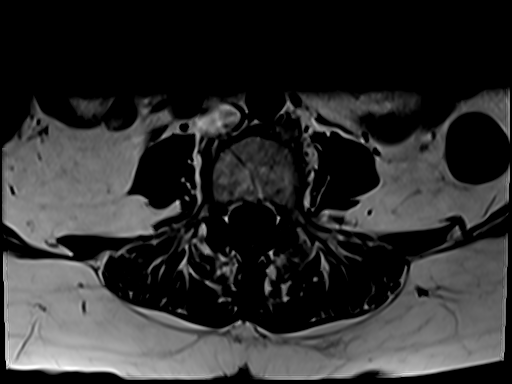
[im 8/24]
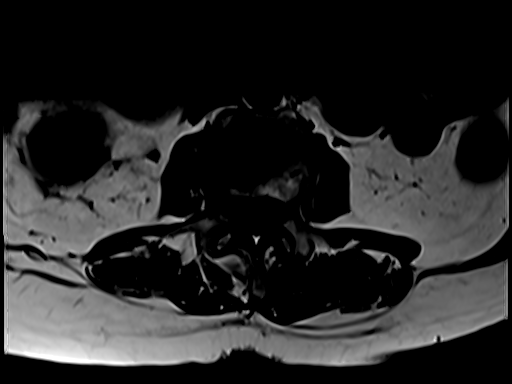
[im 16/24]
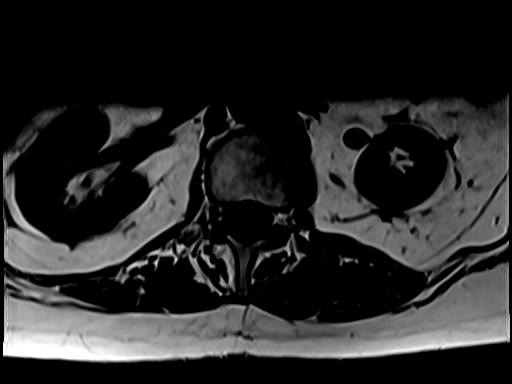
[im 24/24]
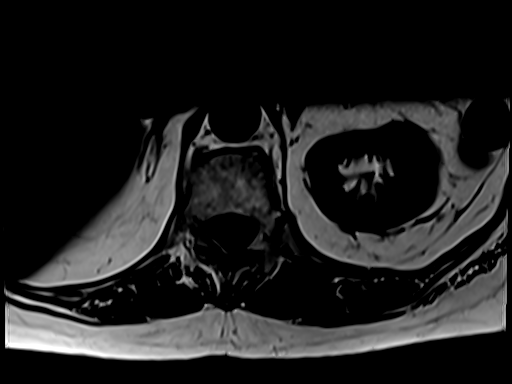

[Series 12: T1 · axial · 4.5mm · 0.43mm/px · z∈[-89,-35]mm · 2 of 13 slices shown (3 of 4)]
[im 1/13]
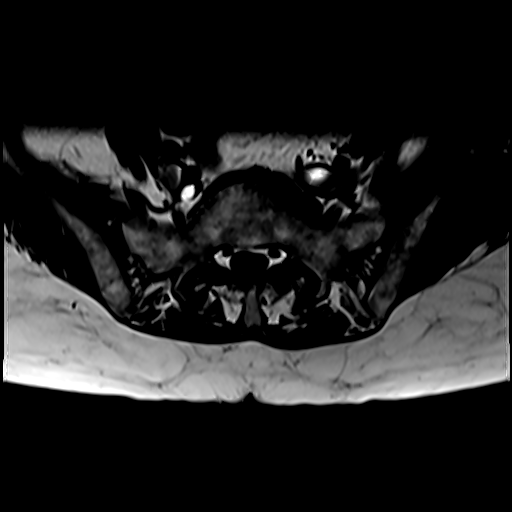
[im 13/13]
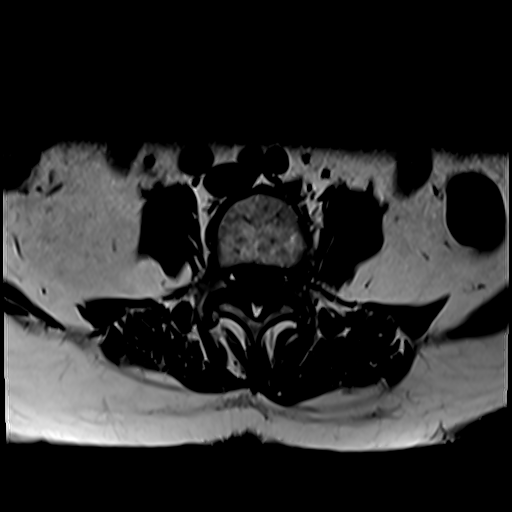

[Series 13: T1 · axial · 4.5mm · 0.43mm/px · z∈[-89,+138]mm · 5 of 35 slices shown (4 of 4)]
[im 1/35]
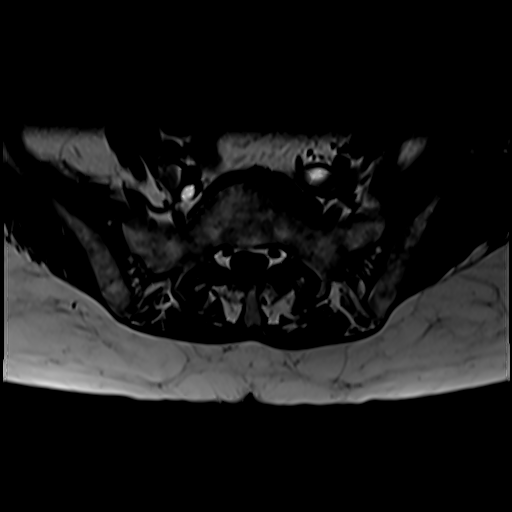
[im 9/35]
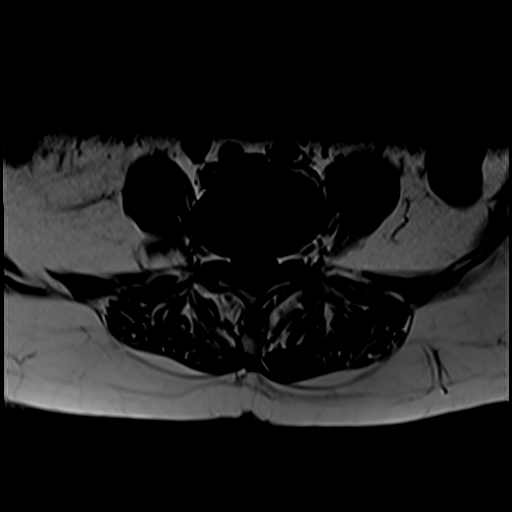
[im 18/35]
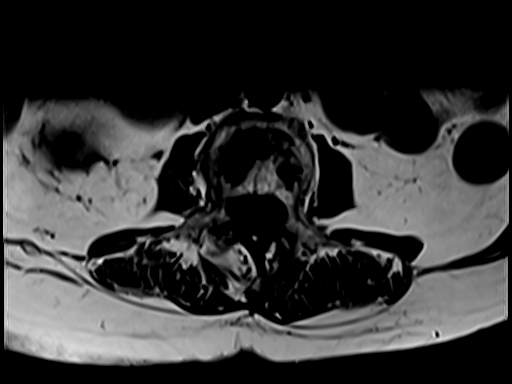
[im 26/35]
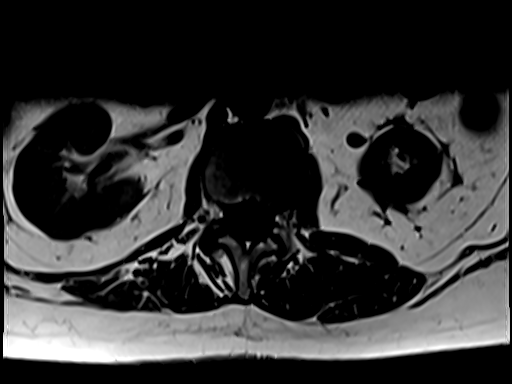
[im 35/35]
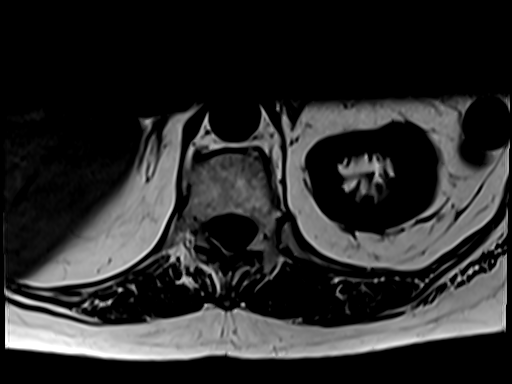

[28 of 48 positions shown; findings below may reference images not displayed]

DIAGNOSTIC STUDIES

EXAM

MAGNETIC RESONANCE IMAGING, SPINAL CANAL AND CONTENTS, LUMBAR; WITHOUT CONTRAST MATERIAL CPT 83272

INDICATION

Low back pain. Left-sided radiculopathy.

TECHNIQUE

Routine imaging sequences were obtained on a GE system.

COMPARISONS

CT 08/26/2020.

FINDINGS

Scoliosis with convexity to the left, an apex at the level of L2/3 and a Cobb angle of 17 degrees.
Chronic appearing mild compression of L3 and status post kyphoplasty at this level. The marrow signa
l is slightly heterogeneous but is likely within normal limits. No significant marrow edema. No
acute displaced fracture or dislocation. The conus is located at the level of L1.

T12-L1: Mild diffuse disc bulge with mild ligamentum flava hypertrophy and facet arthropathy. Mild
central canal stenosis. No neural foramen stenosis.

L1-L2: No significant disc bulge or protrusion.

L2-L3: Mild diffuse disc bulge with mild ligamentum flava hypertrophy and facet arthropathy. Mild
central canal and bilateral neural foramen stenosis.

L3-L4: Mild diffuse disc bulge with mild ligamentum flava hypertrophy and facet arthropathy. Mild
central canal stenosis. Mild bilateral neural foramen stenosis.

L4-L5: Mild diffuse disc bulge with mild ligamentum flava hypertrophy and facet arthropathy. Mild
central canal stenosis. Mild neural foramen stenosis on the left.

L5-S1: Diffuse disc bulge with a moderate degree of ligamentum flava hypertrophy and facet
arthropathy. Moderate central canal stenosis. Severe neural foramen stenosis on the left.

No enlarged retroperitoneal nodes. No paraspinal fluid collections. Paravertebral soft tissues are
not remarkable.

IMPRESSION

L5-S1: Diffuse disc bulge with a moderate degree of ligamentum flava hypertrophy and facet
arthropathy. Moderate central canal stenosis. Severe neural foramen stenosis on the left.

Follow-up with a neurosurgical consultation can be performed.

Tech Notes:

LOW BACK PAIN WITH PAIN IN TO THE LEFT HIP.  RG

## 2023-01-07 ENCOUNTER — Encounter: Admit: 2023-01-07 | Discharge: 2023-01-07 | Payer: MEDICARE

## 2023-06-08 ENCOUNTER — Encounter: Admit: 2023-06-08 | Discharge: 2023-06-08 | Payer: MEDICARE

## 2024-03-25 ENCOUNTER — Encounter: Admit: 2024-03-25 | Discharge: 2024-03-25

## 2024-03-25 DIAGNOSIS — M545 Back pain, lumbosacral: Secondary | ICD-10-CM

## 2024-03-25 DIAGNOSIS — M25552 Pain in left hip: Secondary | ICD-10-CM

## 2024-03-26 ENCOUNTER — Encounter: Admit: 2024-03-26 | Discharge: 2024-03-26

## 2024-03-26 ENCOUNTER — Emergency Department: Admit: 2024-03-26 | Discharge: 2024-03-26

## 2024-03-26 ENCOUNTER — Inpatient Hospital Stay: Admit: 2024-03-26 | Discharge: 2024-03-26

## 2024-03-26 LAB — COMPREHENSIVE METABOLIC PANEL
~~LOC~~ BKR ALBUMIN: 4 g/dL (ref 3.5–5.0)
~~LOC~~ BKR ALK PHOSPHATASE: 71 U/L — ABNORMAL LOW (ref 25–110)
~~LOC~~ BKR ALT: 5 U/L — ABNORMAL LOW (ref 7–56)
~~LOC~~ BKR ANION GAP: 11 10*3/uL — ABNORMAL HIGH (ref 3–12)
~~LOC~~ BKR AST: 11 U/L (ref 7–40)
~~LOC~~ BKR BLD UREA NITROGEN: 36 mg/dL — ABNORMAL HIGH (ref 7–25)
~~LOC~~ BKR CALCIUM: 8.5 mg/dL — AB (ref 8.5–10.6)
~~LOC~~ BKR CHLORIDE: 110 mmol/L — ABNORMAL LOW (ref 98–110)
~~LOC~~ BKR CO2: 17 mmol/L — ABNORMAL LOW (ref 21–30)
~~LOC~~ BKR CREATININE: 2.6 mg/dL — ABNORMAL HIGH (ref 0.40–1.00)
~~LOC~~ BKR GLOMERULAR FILTRATION RATE (GFR): 18 mL/min — ABNORMAL LOW (ref >60–4.80)
~~LOC~~ BKR POTASSIUM: 3.3 mmol/L — ABNORMAL LOW (ref 3.5–5.1)
~~LOC~~ BKR TOTAL BILIRUBIN: 0.3 mg/dL — AB (ref 0.2–1.3)
~~LOC~~ BKR TOTAL PROTEIN: 6.2 g/dL (ref 6.0–8.0)

## 2024-03-26 LAB — CBC AND DIFF
~~LOC~~ BKR ABSOLUTE MONO COUNT: 0.5 10*3/uL (ref 0.00–0.80)
~~LOC~~ BKR MCV: 88 fL (ref 80.0–100.0)
~~LOC~~ BKR RBC COUNT: 3 10*6/uL — ABNORMAL LOW (ref 4.00–5.00)
~~LOC~~ BKR WBC COUNT: 9.5 10*3/uL (ref 4.50–11.00)

## 2024-03-26 MED ORDER — PANTOPRAZOLE 40 MG PO TBEC
40 mg | Freq: Every day | ORAL | 0 refills | Status: CN
Start: 2024-03-26 — End: ?

## 2024-03-26 MED ORDER — ONDANSETRON 4 MG PO TBDI
4 mg | ORAL | 0 refills | Status: DC | PRN
Start: 2024-03-26 — End: 2024-03-29
  Administered 2024-03-27: 14:00:00 4 mg via ORAL

## 2024-03-26 MED ORDER — TIZANIDINE 4 MG PO TAB
2 mg | ORAL | 0 refills | Status: DC | PRN
Start: 2024-03-26 — End: 2024-03-29
  Administered 2024-03-28: 03:00:00 2 mg via ORAL

## 2024-03-26 MED ORDER — ROSUVASTATIN 10 MG PO TAB
10 mg | Freq: Every evening | ORAL | 0 refills | Status: DC
Start: 2024-03-26 — End: 2024-03-29
  Administered 2024-03-27 – 2024-03-29 (×3): 10 mg via ORAL

## 2024-03-26 MED ORDER — LAMOTRIGINE 200 MG PO TAB
200 mg | Freq: Every day | ORAL | 0 refills | Status: DC
Start: 2024-03-26 — End: 2024-03-29
  Administered 2024-03-26 – 2024-03-29 (×3): 200 mg via ORAL

## 2024-03-26 MED ORDER — POTASSIUM CHLORIDE 20 MEQ PO TBTQ
20 meq | Freq: Once | ORAL | 0 refills | Status: CP
Start: 2024-03-26 — End: ?
  Administered 2024-03-26: 08:00:00 20 meq via ORAL

## 2024-03-26 MED ORDER — SENNOSIDES-DOCUSATE SODIUM 8.6-50 MG PO TAB
1 | Freq: Every day | ORAL | 0 refills | Status: DC | PRN
Start: 2024-03-26 — End: 2024-03-29

## 2024-03-26 MED ORDER — GADOBENATE DIMEGLUMINE 529 MG/ML (0.1MMOL/0.2ML) IV SOLN
11 mL | Freq: Once | INTRAVENOUS | 0 refills | Status: CP
Start: 2024-03-26 — End: ?
  Administered 2024-03-26: 17:00:00 11 mL via INTRAVENOUS

## 2024-03-26 MED ORDER — ALPRAZOLAM 1 MG PO TAB
2 mg | Freq: Every evening | ORAL | 0 refills | Status: DC | PRN
Start: 2024-03-26 — End: 2024-03-29
  Administered 2024-03-29: 02:00:00 2 mg via ORAL

## 2024-03-26 MED ORDER — MELATONIN 5 MG PO TAB
5 mg | Freq: Every evening | ORAL | 0 refills | Status: DC | PRN
Start: 2024-03-26 — End: 2024-03-29

## 2024-03-26 MED ORDER — HYDROCODONE-ACETAMINOPHEN 5-325 MG PO TAB
2 | ORAL | 0 refills | Status: DC | PRN
Start: 2024-03-26 — End: 2024-03-27
  Administered 2024-03-26 (×2): 2 via ORAL
  Administered 2024-03-27: 14:00:00 1 via ORAL

## 2024-03-26 MED ORDER — ACETAMINOPHEN 500 MG PO TAB
1000 mg | ORAL | 0 refills | Status: DC
Start: 2024-03-26 — End: 2024-03-26

## 2024-03-26 MED ORDER — FENTANYL CITRATE (PF) 50 MCG/ML IJ SOLN
50 ug | Freq: Once | INTRAVENOUS | 0 refills | Status: CP
Start: 2024-03-26 — End: ?
  Administered 2024-03-26: 06:00:00 50 ug via INTRAVENOUS

## 2024-03-26 MED ORDER — LORATADINE 10 MG PO TAB
10 mg | Freq: Every day | ORAL | 0 refills | Status: DC
Start: 2024-03-26 — End: 2024-03-29
  Administered 2024-03-26 – 2024-03-29 (×3): 10 mg via ORAL

## 2024-03-26 MED ORDER — TRAZODONE 100 MG PO TAB
200 mg | Freq: Every evening | ORAL | 0 refills | Status: DC
Start: 2024-03-26 — End: 2024-03-29
  Administered 2024-03-27 – 2024-03-29 (×3): 200 mg via ORAL

## 2024-03-26 MED ORDER — FENTANYL CITRATE (PF) 50 MCG/ML IJ SOLN
50 ug | Freq: Once | INTRAVENOUS | 0 refills | Status: CP
Start: 2024-03-26 — End: ?
  Administered 2024-03-26: 09:00:00 50 ug via INTRAVENOUS

## 2024-03-26 MED ORDER — ROPINIROLE 1 MG PO TAB
3 mg | Freq: Every evening | ORAL | 0 refills | Status: DC
Start: 2024-03-26 — End: 2024-03-29
  Administered 2024-03-27 – 2024-03-29 (×3): 3 mg via ORAL

## 2024-03-26 MED ORDER — ACETAMINOPHEN 500 MG PO TAB
1000 mg | Freq: Once | ORAL | 0 refills | Status: CP
Start: 2024-03-26 — End: ?
  Administered 2024-03-26: 06:00:00 1000 mg via ORAL

## 2024-03-26 MED ORDER — HEPARIN, PORCINE (PF) 5,000 UNIT/0.5 ML IJ SYRG
5000 [IU] | SUBCUTANEOUS | 0 refills | Status: DC
Start: 2024-03-26 — End: 2024-03-29

## 2024-03-26 MED ORDER — LIDOCAINE 5 % TP PTMD
1 | Freq: Once | TOPICAL | 0 refills | Status: CP
Start: 2024-03-26 — End: ?
  Administered 2024-03-26: 06:00:00 1 via TOPICAL

## 2024-03-26 MED ORDER — CEFTRIAXONE INJ 1GM IVP
1 g | Freq: Once | INTRAVENOUS | 0 refills | Status: CP
Start: 2024-03-26 — End: ?
  Administered 2024-03-26: 09:00:00 1 g via INTRAVENOUS

## 2024-03-26 MED ORDER — HYDROMORPHONE (PF) 2 MG/ML IJ SYRG
.5 mg | INTRAVENOUS | 0 refills | Status: DC | PRN
Start: 2024-03-26 — End: 2024-03-27
  Administered 2024-03-26 – 2024-03-27 (×6): 0.5 mg via INTRAVENOUS

## 2024-03-26 MED ORDER — CITALOPRAM 20 MG PO TAB
40 mg | Freq: Every day | ORAL | 0 refills | Status: DC
Start: 2024-03-26 — End: 2024-03-29
  Administered 2024-03-26 – 2024-03-29 (×3): 40 mg via ORAL

## 2024-03-26 MED ORDER — ONDANSETRON HCL (PF) 4 MG/2 ML IJ SOLN
4 mg | INTRAVENOUS | 0 refills | Status: DC | PRN
Start: 2024-03-26 — End: 2024-03-29
  Administered 2024-03-26 – 2024-03-28 (×3): 4 mg via INTRAVENOUS

## 2024-03-26 MED ORDER — AMITRIPTYLINE(#)/GABAPENTIN/BACLOFEN 2/6/2% TOPICAL CRM
Freq: Three times a day (TID) | TOPICAL | 0 refills | Status: DC | PRN
Start: 2024-03-26 — End: 2024-03-29

## 2024-03-26 MED ORDER — ACETAMINOPHEN 500 MG PO TAB
500 mg | ORAL | 0 refills | Status: DC
Start: 2024-03-26 — End: 2024-03-26

## 2024-03-26 MED ORDER — CYCLOBENZAPRINE 10 MG PO TAB
5 mg | Freq: Three times a day (TID) | ORAL | 0 refills | Status: DC | PRN
Start: 2024-03-26 — End: 2024-03-29
  Administered 2024-03-26: 13:00:00 5 mg via ORAL

## 2024-03-26 MED ORDER — POLYETHYLENE GLYCOL 3350 17 GRAM PO PWPK
1 | Freq: Every day | ORAL | 0 refills | Status: DC | PRN
Start: 2024-03-26 — End: 2024-03-29

## 2024-03-26 NOTE — Progress Notes
 Patient/family declined:  W/in arms reach during toileting/showering and Other: Pt refusing the yellow socks and gate belt .    Patient/family educated on importance of intervention to their safety/quality of care. Patient/family continues to decline care.    Reason Why Patient/Family declined:  Pt states that the socks make her itch    Individualized safety and/or care plan implemented. If additional safety measures implemented, please list them.     Escalated to:  Unit coordinator/charge nurse.

## 2024-03-26 NOTE — Progress Notes
 RT Adult Assessment Note    NAME:Cheyenne Ayers             MRN: [id number]             DOB:August 13, 1946          AGE: 78 y.o.  ADMISSION DATE: 03/25/2024             DAYS ADMITTED: LOS: 0 days    Additional Comments:  Impressions of the patient: alert  Intervention(s)/outcome(s): criteria not met  Patient education that was completed: none  Recommendations to the care team: none    Vital Signs:  Pulse: 92  RR: 18 PER MINUTE  SpO2: 100 %  O2 Device: None (Room air)  Liter Flow:    O2%:      Breath Sounds:      Respiratory Effort:   Respiratory Effort/Pattern: Unlabored  Comments:

## 2024-03-26 NOTE — Care Coordination-Inpatient
 Med Private Night [phone]- [phone] will take calls on this patient until 8 AM of 03/26/2024.     Afterwards, please contact Med 1 for any questions or concerns. Voalte is preferred for communication.     Fred Villa-Chan, MD  Night AOD

## 2024-03-26 NOTE — Progress Notes
 BH 45 END OF SHIFT/ JHFRAT NOTE    Admission Date: 03/25/2024    Acute events, interventions, provider communication: NA    Patient Interventions and Education  Fall Risk/JHFRAT Interventions and Education: (Charting when applicable)  Elimination Interventions : N/A  Medications : Use of gait belt  and Stay within arm's reach during toileting/showering (i.e., dizziness, orthostasis)   Patient Care Equipment: N/A  Mobility: Assist x1 and Utilize walker, cane, or additional walking aid for ambulation  Cognition: N/A  Risk for Moderate/Major Injury: Age: >65 yrs    2. Restraints:  No     Restraint Goal: Patient will be free from injury while physically restrained.  See Docflowsheet for restraint documentation, interventions, education, etc.    Intake and Output:       Intake/Output Summary (Last 24 hours) at 03/26/2024 1620  Last data filed at 03/26/2024 1619  Gross per 24 hour   Intake 0 ml   Output 0 ml   Net 0 ml              Last Bowel Movement Date:  (PTA)

## 2024-03-26 NOTE — Case Management (ED)
 Case Management Progress Note    NAME:Cheyenne Ayers                          MRN: [id number]              DOB:10/05/1946          AGE: 78 y.o.  ADMISSION DATE: 03/25/2024             DAYS ADMITTED: LOS: 0 days      Today's Date: 03/26/2024    PLAN: Complete admissions assessment    Expected Discharge Date: 03/28/2024   Is Patient Medically Stable: No, Please explain: continue to monitor  Are there Barriers to Discharge? no    INTERVENTION/DISPOSITION:  Discharge Planning                 SW attempted to complete admissions assessment. On arrival, pt out of room.  '  Transportation                 Support                 Info or Referral                 Positive SDOH Domains and Potential Barriers                   Medication Needs                                                                                                                                                         Financial                 Legal                 Other                 Discharge Disposition                                                                                                                                                      Selected Continued Care -  Admitted Since 03/25/2024    No services have been selected for the patient.       Rosslyn Coons, LMSW  Available on Voalte  [phone]

## 2024-03-26 NOTE — Progress Notes
 MRI called and said that patient was having pain and needed pain medication to continue the scan. RN pulled medication from floor omnicell and went down to MRI to administer the medication. Used Rover on volte to scan and give the med.

## 2024-03-26 NOTE — Progress Notes
 BH 45 END OF SHIFT/ JHFRAT NOTE    Admission Date: 03/25/2024    Acute events, interventions, provider communication: NA    Patient Interventions and Education  Fall Risk/JHFRAT Interventions and Education: (Charting when applicable)  Elimination Interventions : N/A  Medications : Use of gait belt  and Stay within arm's reach during toileting/showering (i.e., dizziness, orthostasis)   Patient Care Equipment: Needs assistance with patient care equipment when ambulating  Mobility: Assist x1 and Gait belt in use when ambulating  Cognition: Stay within arm's reach while patient ambulating/toileting/showering  Risk for Moderate/Major Injury: Age: >65 yrs    2. Restraints:  No     Restraint Goal: Patient will be free from injury while physically restrained.  See Docflowsheet for restraint documentation, interventions, education, etc.    Intake and Output:     No intake or output data in the 24 hours ending 03/26/24 1420           Last Bowel Movement Date:  (PTA)

## 2024-03-26 NOTE — Progress Notes
 Patient/family declined:  Other: Refused gait belt and yellow nonskid socks .    Patient/family educated on importance of intervention to their safety/quality of care. Patient/family continues to decline care.    Reason Why Patient/Family declined:  states does not need gait belt and preffered to use shoes when ambulating due to the socks making her itch.    Individualized safety and/or care plan implemented. If additional safety measures implemented, please list them.     Escalated to:  Unit coordinator/charge nurse.

## 2024-03-26 NOTE — ED Notes
 77yo F presents to ED w c/o of back pain. Pt states she woke up 3 mornings ago with excruciated left hip pain that radiates down her leg. Pt states she went to OSH and was sent home with pain meds with no improvement. Pt has hx of arthritis with multiple joint replacements. Pt is A&Ox4, resp even and unlabored on RA, skin p/w/d, VSS. Resting in cart in lowest locked position, call light within reach. Dr. Springman at bedside.

## 2024-03-27 ENCOUNTER — Encounter: Admit: 2024-03-27 | Discharge: 2024-03-27

## 2024-03-27 ENCOUNTER — Inpatient Hospital Stay: Admit: 2024-03-27 | Discharge: 2024-03-27

## 2024-03-27 DIAGNOSIS — M5416 Radiculopathy, lumbar region: Secondary | ICD-10-CM

## 2024-03-27 LAB — CULTURE-URINE W/SENSITIVITY

## 2024-03-27 MED ORDER — PROCHLORPERAZINE EDISYLATE 5 MG/ML IJ SOLN
10 mg | Freq: Once | INTRAVENOUS | 0 refills | Status: CP
Start: 2024-03-27 — End: ?
  Administered 2024-03-27: 19:00:00 10 mg via INTRAVENOUS

## 2024-03-27 MED ORDER — HYDROCODONE-ACETAMINOPHEN 5-325 MG PO TAB
1 | ORAL | 0 refills | Status: DC | PRN
Start: 2024-03-27 — End: 2024-03-29
  Administered 2024-03-28 – 2024-03-29 (×7): 1 via ORAL

## 2024-03-27 MED ORDER — HYDROMORPHONE (PF) 2 MG/ML IJ SYRG
1 mg | INTRAVENOUS | 0 refills | Status: DC | PRN
Start: 2024-03-27 — End: 2024-03-28
  Administered 2024-03-27 – 2024-03-28 (×6): 1 mg via INTRAVENOUS

## 2024-03-27 NOTE — Progress Notes
 BH 45 END OF SHIFT/ JHFRAT NOTE    Admission Date: 03/25/2024    Acute events, interventions, provider communication: NA    Patient Interventions and Education  Fall Risk/JHFRAT Interventions and Education: (Charting when applicable)  Elimination Interventions : N/A  Medications : Bedside commode (i.e., urgency, frequency, dizziness)  and Use of gait belt   Patient Care Equipment: Needs assistance with patient care equipment when ambulating  Mobility: Assist x1, Gait belt in use when ambulating, and Utilize walker, cane, or additional walking aid for ambulation  Cognition: N/A  Risk for Moderate/Major Injury: Age: >65 yrs    2. Restraints:  No     Restraint Goal: Patient will be free from injury while physically restrained.  See Docflowsheet for restraint documentation, interventions, education, etc.    Intake and Output:     No intake or output data in the 24 hours ending 03/27/24 1934           Last Bowel Movement Date:  (PTA)

## 2024-03-27 NOTE — Progress Notes
 Cheyenne Ayers  [id number]  19-Feb-1946  78 y.o.      1. Disruptive Behavior: Patient asking for IV pain medication over PO pain medication, then started gagging PO pain medication instantly after put into mouth.      2. Plan and communication: Patient telling UC the doctor wants me to start taking my norco every 6 hours scheduled, and in between if I need something, to take dilaudid, I think I need to start out with the IV one.  UC stated will bring in Norco.  IV pain medication not due.  Patient emptied 2 Norco pills from above in air into mouth, immediately started gagging on Norco.  Patient began vomiting, vomited up 1 Norco that could be seen.  Patient insisting that the norco makes her vomit.  UC educated patient that she immediately began gagging on pills when swallowing and that caused vomiting.   3. Patient response:Patient verbalized understanding. No, with explanation: Patient insists that Norco makes her vomit.    4. Department Manager/Leader notified (name/date/time): Bethanne Brooks, [place] Manager 03/27/2024 [phone]  5. Physician notified (name/date/time): Annye Basque, MD 03/27/24 at 10:01.

## 2024-03-27 NOTE — Unmapped
 Patient/family declined:  Bed/chair alarm.    Patient/family educated on importance of intervention to their safety/quality of care. Patient/family continues to decline care.    Reason Why Patient/Family declined:  Pt declines socks, gate belt. Bed alarm.    Individualized safety and/or care plan implemented. If additional safety measures implemented, please list them.     Escalated to:  Unit coordinator/charge nurse.

## 2024-03-27 NOTE — Progress Notes
 Internal Medicine Daily Progress Note      Patient's Name:  Cheyenne Ayers MRN: [id number]   Today's Date:  03/27/2024  Admission Date: 03/25/2024  LOS: 1 day    Problem list  Principal Problem:    Back pain, lumbosacral  Active Problems:    Left hip pain    Acute on chronic back pain    Brief Hospital Course     Taelor Alyzza Andringa is a 78 y.o. female with significant history of CKD IV/V, anemia of chronic disease, HTN, HLD, bipolar disorder on chronic benzodiazepines, chronic pain on chronic opioids and muscle relaxants, severe OA who presented to the hospital for C/C of  acute on chronic back pain. Admitted to medicine for further management.       Interval Updates - Today 03/27/2024:  > MRI T and L spine: thoracic spondylosis with multiple levels of mild spinal and foraminal stenosis, moderate chronic superior endplate compressions fracture deformity of L3 s/p vertebroplasty, no acute compression fracture deformity, multilevel degenerative change throughout the lumbar spine with multilevel central spinal stenosis with mild spinal stenosis, multilevel foraminal stenosis- greatest at L5-S1 on the L side   >Neurosurgery spine consulted: No acute surgical infervention, consider pain medicine consult for L TFESI at L5-S1, follow up as an outpatient in 6 weeks, signed off  >Interventional pain consult placed  >Norco decreased to 5 mg q4h given nausea with 10 mg q6h, IV dilaudid 1 mg q4h    Assessment and Plan      #Acute on Chronic LBP:  #Sciatica:  #Severe OA s/p bilateral knee, bilateral hip, right shoulder replacements:  #Difficult ambulating 2/2 to pain:  Reportedly slept on hard mattress without her typical mattress cover, woke up in the morning with severe left sided lower back, hip, radiating down left leg, unrelieved by home Norco. No red flag symptoms. Lives alone at home and having difficulty ambulating without assistance 2/2 to pain.   *CTAP:  1.  No acute abdominopelvic inflammatory mass, small bowel obstruction, or ascites.   2. Mild cutaneous thickening and fat stranding in the left lower flank, may reflect contusion or mild fat necrosis, correlation for cellulitis needed.   3. Distal colonic diverticulosis.   -MRI T and L spine: thoracic spondylosis with multiple levels of mild spinal and foraminal stenosis, moderate chronic superior endplate compressions fracture deformity of L3 s/p vertebroplasty, no acute compression fracture deformity, multilevel degenerative change throughout the lumbar spine with multilevel central spinal stenosis with mild spinal stenosis, multilevel foraminal stenosis- greatest at L5-S1 on the L side   PLAN  -Multimodal pain regimen limited by kidney disease               -Tylenol ATC, flexeril PRN, amitriptyline/gabapentin/baclofen cream               -Norco decreased to 5 mg q4h given nausea with 10 mg q6h, IV dilaudid 1 mg q4h               -Lidocaine patch               -Heat compress  -PT/OT consulted  -Neurosurgery spine consulted: No acute surgical infervention, consider pain medicine consult for L TFESI at L5-S1, follow up as an outpatient in 6 weeks, signed off  -Interventional pain consult placed     #Asymptomatic pyuria/bacteriuria:  *UA 11-20 wbc, few bacter  -s/p ceftriaxone x1 in ED, will hold off further antibiotics as asymptomatic right now  -UC= NGTD     #  Bipolar II:  #Chronic Benzo Use:  -C/w PTA lamictal, citalopram, trazodone, requip  -C/w PTA xanax qhs PRN     #CKD 4-5:  *Cr 2.6 (3.05 on 3/14)  -Pending evaluation as outpatient for peritoneal dialysis  -Trend Cr  -Avoid nephrotoxic agents    FEN:  > DIET REGULAR  > IVF: None    Prophylaxis Review:  VTE ppx: Heparin-patient refusing  GI ppx: Not indicated  Last bowel movement:  (PTA)    Code Status:  Full Code  Disposition: Admit to Med 1        PT recommendation: pending      Seen and discussed with Dr. Sanjuan Dame, MD     [place], MD      Subjective:     Patient seen resting in bed this morning. Reports continued pain. States that the 10 mg q6h norco is too much and makes her nauseous. Notes IV pain meds are working best. Denies any bowel/bladder issues, fevers, chills, night sweats. Hopeful to have improvement in her pain.       Objective:       BP: (141-177)/(59-78)   Temp:  [36.7 ?C (98.1 ?F)-37.2 ?C (99 ?F)]   Pulse:  [78-96]   Respirations:  [16 PER MINUTE-18 PER MINUTE]   SpO2:  [96 %-100 %]   O2 Device: (P) None (Room air)    Physical Exam  Constitutional:       Appearance: Normal appearance.   HENT:      Head: Normocephalic.      Mouth/Throat:      Mouth: Mucous membranes are moist.      Pharynx: Oropharynx is clear.   Eyes:      Conjunctiva/sclera: Conjunctivae normal.      Pupils: Pupils are equal, round, and reactive to light.   Cardiovascular:      Rate and Rhythm: Normal rate and regular rhythm.      Heart sounds: Murmur heard.   Pulmonary:      Effort: Pulmonary effort is normal. No respiratory distress.      Breath sounds: No wheezing or rhonchi.   Abdominal:      General: Abdomen is flat. Bowel sounds are normal.      Tenderness: There is no abdominal tenderness. There is no guarding.   Musculoskeletal:         General: Tenderness (Tenderness in L buttock region and lumbar paraspinal muscles) present.      Cervical back: Normal range of motion.      Right lower leg: No edema.      Left lower leg: No edema.      Comments: Positive straight leg test   Skin:     General: Skin is warm and dry.      Capillary Refill: Capillary refill takes less than 2 seconds.   Neurological:      General: No focal deficit present.      Mental Status: She is alert and oriented to person, place, and time. Mental status is at baseline.      Cranial Nerves: No cranial nerve deficit.      Sensory: No sensory deficit.   Psychiatric:         Mood and Affect: Mood normal.         Thought Content: Thought content normal.              Intake/Output Summary (Last 24 hours) at 03/27/2024 1333  Last data filed at 03/26/2024 1619  Gross per  24 hour   Intake 0 ml   Output 0 ml   Net 0 ml      Recent Labs     03/26/24  0058 03/27/24  0447   HGB 9.0* 8.9*   WBC 9.50 6.60   PLTCT 225 225   NA 138 138   K 3.3* 3.8   CL 110 106   CO2 17* 23   BUN 36* 31*   CR 2.67* 2.56*   GLU 90 136*   CA 8.5 9.4   MG  --  1.9   PO4  --  3.4   ALBUMIN 4.0 4.1   AST 11 13   ALT 5* 6*   ALKPHOS 71 67   TOTBILI 0.3 0.4       No results for input(s): BNP, HIGHSTROPI, HSTROP0HR, HSTROP2HR, HSTROPDELTA in the last 72 hours.     No image results found.     Meds:  Scheduled Meds:citalopram (CeleXA) tablet 40 mg, 40 mg, Oral, QDAY  heparin (porcine) PF syringe 5,000 Units, 5,000 Units, Subcutaneous, Q8H  lamoTRIgine (LaMICtal) tablet 200 mg, 200 mg, Oral, QDAY  loratadine (CLARITIN) tablet 10 mg, 10 mg, Oral, QDAY  rOPINIRole (REQUIP) tablet 3 mg, 3 mg, Oral, QHS  rosuvastatin (CRESTOR) tablet 10 mg, 10 mg, Oral, QHS  traZODone (DESYREL) tablet 200 mg, 200 mg, Oral, QHS    Continuous Infusions:  PRN and Respiratory Meds:ALPRAZolam QHS PRN, amitriptyline/gabapentin/baclofen(#) TID PRN, cyclobenzaprine TID PRN, HYDROcodone/acetaminophen Q4H PRN, HYDROmorphone (DILAUDID) injection Q4H PRN, melatonin QHS PRN, ondansetron Q6H PRN **OR** ondansetron (ZOFRAN) IV Q6H PRN, polyethylene glycol 3350 QDAY PRN, sennosides-docusate sodium QDAY PRN, tiZANidine Q8H PRN      Wounds:        Nutrition:  Malnutrition Details:

## 2024-03-27 NOTE — Consults
 Neurosurgery Consult History and Physical Note      Admission Date: 03/25/2024                                                LOS: 1 day    Reason for Consult:  Is there any role for surgery?     Consult type: Co-Management w/Signed Orders    Consulting Physician: Neurosurgery Attendings: Loraine Maple, MD    Requesting Physician: Sanjuan Dame, MD      Assessment/Plan:  Cheyenne Ayers is a 78 y.o. female with relevant PMH of CKD, anemia, HTN, HLD, Bipolar disorder, prior kyphoplasty at L3, chronic pain and severe OA who initially presented on 3/30 with complains of acute lower back pain with left sided radicular pain. Neurosurgery consulted for surgical evaluation. Imaging demonstrates multilevel degenerative changes with multilevel central spinal stenosis mild central spinal stenosis. There is multilevel foraminal stenosis, greatest at L5-S1 on the left where it is marked. Mild anterolisthesis at L5-S1. Also noted L3 vertebroplasty. Vacuum disc noted at L5-S1. No acute fractures. Patient is alert and oriented. Symmetric full strength in both upper and lower extremities bilaterally. Diminished sensation to light touch over left lateral thigh down leg in S1 distribution. Deferred hoffman's testing due to pain in joints secondary to RA, no clonus, no hyperreflexia. No acute bowel or bladder incontinence. No saddle anesthesia.    Recommendations:  - No acute surgical intervention  - No brace necessary  - Recommend Physical therapy  - Continue pain control per primary team  - Can consider pain medicine consultation for L TFESI at L5-S1  - Otherwise rest of care per primary team  - Follow-up: The patient may follow up in the Neurosurgery clinic with Dr. Loraine Maple, MD in 6 weeks.  We will arrange follow-up appointment and imaging.   - Patient and plan discussed with Dr. Alma Friendly  - Thank you for involving neurosurgery in this patient's care. We will sign off at this time.  Please reach out to [phone] if there are further neurosurgical questions or concerns.       Epimenio Sarin, MD    Please page [phone] with questions.  _____________________________________________________________________    History of Present Illness:   Cheyenne Ayers is a 78 y.o. female with relevant PMH of CKD, anemia, HTN, HLD, Bipolar disorder, prior kyphoplasty at L3, chronic pain and severe OA who initially presented on 3/30 with complains of acute lower back pain with left sided radicular pain. Neurosurgery consulted for surgical evaluation.    Patient was in normal state of health until 2-3 days ago when she developed acute on chronic lower back pain,  L>R.  Pain radiates down back of left leg. Patient reportedly slept on hard mattress without her typical mattress cover, woke up in the morning with severe left sided lower back, hip, radiating down left leg, unrelieved by home Norco. No red flag symptoms. Lives alone at home and having difficulty ambulating without assistance 2/2 to pain.    She has receives steroid injections in the past in her T spine. She explains this is difficult for her as she has to travel to [place] to get her injections.    Past Medical History:  Past Medical History:    Bipolar disorder (CMS-HCC)    Carotid stenosis    Chest pressure    Chronic back  pain    Chronic pain    GERD (gastroesophageal reflux disease)    HTN (hypertension)    Hyperlipidemia    Migraine    Non compliance w medication regimen    Post-polio syndrome (CMS-HCC)       Past Surgical History:  Surgical History:   Procedure Laterality Date    HX APPENDECTOMY      HX HYSTERECTOMY      HX JOINT REPLACEMENT      HX ROTATOR CUFF REPAIR      RIGHT        Social History:  Social History     Tobacco Use    Smoking status: Former     Current packs/day: 0.00     Types: Cigarettes     Quit date: 10/29/1984     Years since quitting: 39.4    Smokeless tobacco: Never   Substance Use Topics    Alcohol use: Not Currently     Comment: rarely    Drug use: No Family History:  Family History   Problem Relation Name Age of Onset    Cancer Mother      Stroke Father         Allergies:    Calcium channel blocking agent diltiazem analogues, Nitrous oxide, Sulfa dyne, Ambien [zolpidem], Halcion [triazolam], Lincocin [lincomycin], and Stadol [butorphanol tartrate]    Medications:  PTA:  Current Outpatient Medications   Medication Instructions    ALPRAZolam (XANAX) 2 mg, Oral, AT BEDTIME PRN    amLODIPine (NORVASC) 10 mg, Oral, DAILY    aspirin 81 mg, Oral, DAILY    CHOLEcalciferoL (vitamin D3) (OPTIMAL D3) 50,000 Units, Oral, EVERY  7 DAYS    citalopram (CELEXA) 40 mg, Oral, DAILY    cyclobenzaprine HCl (CYCLOBENZAPRINE PO) Oral    ferrous sulfate (FEOSOL) 325 mg, Oral, THREE TIMES DAILY WITH MEALS, Take on an empty stomach at least 1 hour before or 2 hours after food.    fexofenadine (ALLEGRA) 180 mg, Oral, DAILY,      HYDROcodone/acetaminophen (NORCO) 5/325 mg tablet 1 tablet, Oral, EVERY  6 HOURS PRN    lamoTRIgine (LAMICTAL) 200 mg, DAILY    lisinopril (ZESTRIL) 5 mg, DAILY    MELATONIN PO 20 mg, AT BEDTIME DAILY    omeprazole DR (PRILOSEC) 40 mg, Oral, DAILY BEFORE BREAKFAST    rOPINIRole (REQUIP) 3 mg, Oral, AT BEDTIME DAILY    rosuvastatin (CRESTOR) 10 mg, Oral, AT BEDTIME DAILY    traZODone (DESYREL) 300 mg, Oral, AT BEDTIME DAILY        Inpatient:  Scheduled Meds:citalopram (CeleXA) tablet 40 mg, 40 mg, Oral, QDAY  heparin (porcine) PF syringe 5,000 Units, 5,000 Units, Subcutaneous, Q8H  lamoTRIgine (LaMICtal) tablet 200 mg, 200 mg, Oral, QDAY  loratadine (CLARITIN) tablet 10 mg, 10 mg, Oral, QDAY  rOPINIRole (REQUIP) tablet 3 mg, 3 mg, Oral, QHS  rosuvastatin (CRESTOR) tablet 10 mg, 10 mg, Oral, QHS  traZODone (DESYREL) tablet 200 mg, 200 mg, Oral, QHS    Continuous Infusions:  PRN and Respiratory Meds:ALPRAZolam QHS PRN, amitriptyline/gabapentin/baclofen(#) TID PRN, cyclobenzaprine TID PRN, HYDROcodone/acetaminophen Q6H PRN, HYDROmorphone (DILAUDID) injection Q4H PRN, melatonin QHS PRN, ondansetron Q6H PRN **OR** ondansetron (ZOFRAN) IV Q6H PRN, polyethylene glycol 3350 QDAY PRN, sennosides-docusate sodium QDAY PRN, tiZANidine Q8H PRN        Physical Exam:  Vital Signs:  Last Filed in 24 hours Vital Signs:  24 hour Range    BP: 159/78 (03/31 0425)  Temp: 36.7 ?C (98.1 ?F) (03/31  0407)  Pulse: 88 (03/31 0425)  Respirations: 18 PER MINUTE (03/31 0407)  SpO2: 98 % (03/31 0407)  O2 Device: None (Room air) (03/31 0407) BP: (141-177)/(59-78)   Temp:  [36.6 ?C (97.9 ?F)-37.2 ?C (99 ?F)]   Pulse:  [78-96]   Respirations:  [16 PER MINUTE-18 PER MINUTE]   SpO2:  [98 %-100 %]   O2 Device: None (Room air)     General appearance: No acute distress    Neurologic Exam:   Mental Status: Awake, alert and oriented x 4, fluent speech, normal cognition  Motor:   AA EF EE WF WE FF FE FA G HF KF KE DF PF EHL   Left 5 5 5 5 5 5 5 5 5 5 5 5 5 5 5    Right 5 5 5 5 5 5 5 5 5 5 5 5 5 5 5    Normal muscle bulk and tone  Sensation: Diminished sensation to light touch over left lateral thigh down leg in S1 distribution  Deep Tendon Reflexes: Hyporeflexic  No clonus bilaterally  Gait: Not assessed    LabTests:  Hematology:    Lab Results   Component Value Date    HGB 8.9 03/27/2024    HGB 11.6 11/05/2018    HCT 26.2 03/27/2024    HCT 34.2 11/05/2018    PLTCT 225 03/27/2024    PLTCT 327 11/05/2018    WBC 6.60 03/27/2024    WBC 10.3 11/05/2018    NEUT 74.5 03/27/2024    NEUT 72 11/04/2018    ANC 4.90 03/27/2024    ANC 9.30 11/04/2018    ALC 1.10 03/27/2024    ALC 2.90 11/04/2018    MONA 6.8 03/27/2024    MONA 6 11/04/2018    AMC 0.40 03/27/2024    AMC 0.80 11/04/2018    EOSA 0.7 03/27/2024    EOSA 0 11/04/2018    ABC 0.10 03/27/2024    ABC 0.00 11/04/2018    MCV 88.6 03/27/2024    MCV 87.3 11/05/2018    MCH 30.0 03/27/2024    MCH 29.5 11/05/2018    MCHC 33.9 03/27/2024    MCHC 33.8 11/05/2018    MPV 7.0 03/27/2024    MPV 7.0 11/05/2018    RDW 14.2 03/27/2024    RDW 14.6 11/05/2018     General Chemistry: Lab Results   Component Value Date    NA 138 03/27/2024    NA 140 11/04/2018    K 3.8 03/27/2024    K 3.2 11/04/2018    CL 106 03/27/2024    CL 107 11/04/2018    CO2 23 03/27/2024    CO2 20 11/04/2018    BUN 31 03/27/2024    BUN 22 11/04/2018    CR 2.56 03/27/2024    CR 1.24 11/04/2018    GLU 136 03/27/2024    GLU 125 11/04/2018    CA 9.4 03/27/2024    CA 9.1 11/04/2018    MG 1.9 03/27/2024    MG 2.3 11/04/2018    PO4 3.4 03/27/2024    PO4 3.0 11/04/2018     General Chemistry:   Lab Results   Component Value Date    GAP 9 03/27/2024    GAP 13 11/04/2018    ALBUMIN 4.1 03/27/2024    ALBUMIN 4.1 11/04/2018    TOTBILI 0.4 03/27/2024    TOTBILI 0.2 11/04/2018    DBILI 0.07 02/09/2012    TOTPROT 6.5 03/27/2024    TOTPROT 6.6 11/04/2018  AST 13 03/27/2024    AST 11 11/04/2018    ALT 6 03/27/2024    ALT 10 11/04/2018    ALKPHOS 67 03/27/2024    ALKPHOS 81 11/04/2018       Radiology and other Diagnostics Review:    MRI and CT imaging reviewed      Glenn Lange, MD  03/27/2024  Pager [phone]

## 2024-03-27 NOTE — Case Management (ED)
 Case Management Admission Assessment    NAME:Cheyenne Ayers                          MRN: [id number]             DOB:[birth year]          AGE: 78 y.o.  ADMISSION DATE: 03/25/2024             DAYS ADMITTED: LOS: 1 day      Today?s Date: 03/27/2024    Source of Information: Patient and EMR       Plan  Plan: Case Management Assessment  This CM met with pt for assessment on this date.  Provided contact information and explanation of SW/NCM roles.  Reviewed [facility], Preparing for Discharge, and [facility] hand-outs.  Provided opportunity for questions and discussion. Pt/family encouraged to contact Case Management team with questions and concerns during hospitalization and until patient is able to transition back to the patient's primary care physician.  Patient's RW at bedside.   Patient is currently going to PT at [facility] for her shoulder. Going to appointments has become harder for her due to her back pain. Pending interventions and pain control patient may be interested in [facility].   CM will continue to follow and assist in discharge planning as needed.  Plan to discharge home when medically stable, family will provide transportation.     Patient Address/Phone  [address]  [place] [address]  [phone] (home)     Emergency Contact  Extended Emergency Contact Information  Primary Emergency Contact: Sharlotte [place]  Home Phone: [phone]  Mobile Phone: [phone]  Relation: Daughter  Interpreter needed? No    Forensic scientist: Yes, patient has a healthcare directive  Type of Healthcare Directive: Durable power of attorney for healthcare  Location of Healthcare Directive: Current and verified in document scanning system  Would patient like to fill out a (a new) Healthcare Directive?: No, patient declined  Psych Advance Directive (Psych unit only): No, patient does not have a Social research officer, government  Does the Patient Need Case Management to Arrange Discharge Transport? (ex: facility, ambulance, wheelchair/stretcher, Medicaid, cab, other): No  Will the Patient Use Family Transport?: Yes    Expected Discharge Date  03/28/2024 1:00 PM    Living Situation Prior to Admission  Living Arrangements  Type of Residence: Home, independent  Living Arrangements: Alone  How many levels in the residence?: 2  Can patient live on one level if needed?: Yes  Assistance needed prior to admit or anticipated on discharge: No  Level of Function   Prior level of function: Independent  Cognitive Abilities   Cognitive Abilities: Alert and Oriented, Participates in decision making    Financial Resources  Coverage  Primary Insurance: Medicare  Secondary Insurance: Medicare Supplement  Additional Coverage: None  Medication Coverage    Medication Coverage: Medicare Part D  Have you experienced a noticeable increase in your copay costs recently?: No  Are current medications affordable?: Yes  Do You Use a Co-Pay Card or a Medication Assistance Program to Help Manage Medication Costs?: No  Do You Manage Your Own Medications?: Yes  Source of Income   Source Of Income: SSI  Financial Assistance Needed?  Medications are affordable.     Psychosocial Needs  Mental Health  Mental Health History: No  Substance Use History  Substance  Use History Screen: No  Other  N/a    Current/Previous Services  PCP  Susan Ensign, [phone], [phone]  Has been seen within one year.   Pharmacy    [facility] - [place], [place] - [address]  [address]  [place] [address]  Phone: [phone] Fax: [phone]    Durable Medical Equipment   Durable Medical Equipment at home: [facility]  Receiving home health: No  Hemodialysis or Peritoneal Dialysis  Undergoing hemodialysis or peritoneal dialysis: No  Tube/Enteral Feeds  Receive tube/enteral feeds: No  Infusion  Receive infusions: No  Private Duty  Private duty help used: No  Home and Community Based Services  Home and community based services: No  Ryan White  Ryan White: N/A  Hospice  Hospice: No  Outpatient Therapy  PT: Yes  Name of rehab location[facility]group: [facility]  Would patient return for future services?: Yes  OT: No  SLP: No  Skilled Nursing Facility/Nursing Home  SNF: In the past  Name of Facility: [facility]  Would patient return for future services?: Yes  NH: No  Inpatient Rehab  IPR: In the past  When did patient receive care?: 2019  Name of Facility:  Rehab  Would patient return for future services?: Yes  [facility]  LTACH: No  Acute Hospital Stay  Acute Hospital Stay: In the past  Was patient's stay within the last 30 days?: No      Suzzette Eth, RN, BSN Nurse Case Manager  Available on Voalte

## 2024-03-28 ENCOUNTER — Inpatient Hospital Stay: Admit: 2024-03-28 | Discharge: 2024-03-28

## 2024-03-28 ENCOUNTER — Encounter: Admit: 2024-03-28 | Discharge: 2024-03-28

## 2024-03-28 ENCOUNTER — Inpatient Hospital Stay: Admit: 2024-03-28 | Discharge: 2024-03-27

## 2024-03-28 DIAGNOSIS — M5416 Radiculopathy, lumbar region: Secondary | ICD-10-CM

## 2024-03-28 LAB — CBC AND DIFF
~~LOC~~ BKR MCH: 29 pg — ABNORMAL HIGH (ref 26.0–34.0)
~~LOC~~ BKR MCHC: 33 g/dL — ABNORMAL LOW (ref 32.0–36.0)

## 2024-03-28 MED ORDER — DEXAMETHASONE SODIUM PHOS (PF) 10 MG/ML IJ EPIDURAL SOLN
10 mg | Freq: Once | EPIDURAL | 0 refills | Status: CP
Start: 2024-03-28 — End: ?
  Administered 2024-03-28: 17:00:00 10 mg via EPIDURAL

## 2024-03-28 MED ORDER — IOHEXOL 240 MG IODINE/ML IV SOLN
2.5 mL | Freq: Once | EPIDURAL | 0 refills | Status: CP
Start: 2024-03-28 — End: ?
  Administered 2024-03-28: 17:00:00 2.5 mL via EPIDURAL

## 2024-03-28 MED ORDER — FAMOTIDINE 20 MG PO TAB
20 mg | Freq: Two times a day (BID) | ORAL | 0 refills | Status: DC | PRN
Start: 2024-03-28 — End: 2024-03-29
  Administered 2024-03-28: 21:00:00 20 mg via ORAL

## 2024-03-28 NOTE — Progress Notes
 PHYSICAL THERAPY  NOTE/DISCHARGE     Name: Cheyenne Ayers   MRN: [id number]     DOB: 01/08/1946      Age: 78 y.o.  Admission Date: 03/25/2024     LOS: 2 days     Date of Service: 03/28/2024      Based on discussion with OT, patient's current level of function suggests that patient will progress with one discipline. PT will sign off at this time. Please re-consult if a change in functional status occurs.      Therapist: Alda Hummer, PT, DPT [phone]  Date: 03/28/2024

## 2024-03-28 NOTE — Procedures
 Attending Surgeon: Philomena Course, MD    Anesthesia: Local    Pre-Procedure Diagnosis:   1. Lumbar radiculopathy        Post-Procedure Diagnosis:   1. Lumbar radiculopathy        [facility] SP[facility] TRANSFORAMINAL LUMBAR/SACRAL THERAPEUTIC  Procedure: transforaminal epidural    Laterality: left   on 03/28/2024 10:30 AM  Location: lumbar - L5-S1      Consent:   Consent obtained: written  Consent given by: patient    Discussed with patient the purpose of the treatment/procedure, other ways of treating my condition, including no treatment/ procedure and the risks and benefits of the alternatives. Patient has decided to proceed with treatment/procedure.        Universal Protocol:  Relevant documents: relevant documents present and verified  Test results: test results available and properly labeled  Imaging studies: imaging studies available  Required items: required blood products, implants, devices, and special equipment available  Site marked: the operative site was marked  Patient identity confirmed: Patient identify confirmed verbally with patient.        Time out: Immediately prior to procedure a time out was called to verify the correct patient, procedure, equipment, support staff and site/side marked as required      Procedures Details:   Indications: pain   Prep: chlorhexidine  Estimated Blood Loss: minimal  Specimens: none  Number of Levels: 1  Approach: left paramedian  Guidance: fluoroscopy  Contrast: Procedure confirmed with contrast under live fluoroscopy.  Needle and Epidural Catheter: quincke  Needle size: 25 G  Injection procedure: Negative aspiration for blood  Amount Injected:   L5-S1: 2mL  Patient tolerance: Patient tolerated the procedure well with no immediate complications. Pressure was applied, and hemostasis was accomplished.  Comments: 10mg  dexamethasone and 1 mL PF NS injected  This patient's clinical history, exam, AND imaging support radiculopathy AND there is a significant impact on quality of life and function AND the pain has been present for at least 4 weeks AND they have failed to improve with noninvasive conservative care.        Estimated blood loss: none or minimal  Specimens: none  Patient tolerated the procedure well with no immediate complications. Pressure was applied, and hemostasis was accomplished.  Administrations This Visit       dexamethasone PF (DECADRON) epidural injection 10 mg       Admin Date  03/28/2024 Action  Given Dose  10 mg Route  Epidural Documented By  Delfina Redwood, RN              iohexoL (OMNIPAQUE-240) 240 mg/mL injection 2.5 mL       Admin Date  03/28/2024 Action  Given Dose  2.5 mL Route  Epidural Documented By  Delfina Redwood, RN

## 2024-03-28 NOTE — Progress Notes
 [facility] END OF SHIFT/ JHFRAT NOTE    Admission Date: 03/25/2024    Acute events, interventions, provider communication: N/A    Patient Interventions and Education  Fall Risk/JHFRAT Interventions and Education: (Charting when applicable)  Elimination Interventions : N/A  Medications : Educate patient on medication side effects  Patient Care Equipment: Needs assistance with patient care equipment when ambulating and Ensure environment is free of clutter and walkways are clear from tripping hazards  Mobility: Assist x1  Cognition: N/A  Risk for Moderate/Major Injury: Age: >65 yrs and Risk for fracture    2. Restraints:  No     Restraint Goal: Patient will be free from injury while physically restrained.  See Docflowsheet for restraint documentation, interventions, education, etc.    Intake and Output:       Intake/Output Summary (Last 24 hours) at 03/28/2024 0545  Last data filed at 03/28/2024 0354  Gross per 24 hour   Intake 240 ml   Output 0 ml   Net 240 ml              Last Bowel Movement Date:  (pta)

## 2024-03-28 NOTE — Progress Notes
 Patient seen and examined. No changes to below H&P from 03/27/24. Plan for left L5-S1 TFESI.      Interventional Pain Consult Note      Admission Date: (Not on file)                                                LOS: 1 day    Reason for Consult:  Neurosurgery recommended pain medicine consultation for L TFESI at L5-S1     Consult type: Written opinion only    Assessment/Plan    Cheyenne Ayers is a 78 y.o. 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 Presented on 3/29 for evaluation and management of acute on chronic back pain.  Neurosurgery recommending left L5-S1 TFESI.    MRI T/L spine:  IMPRESSION     Thoracic spine:   1.  No MRI evidence of acute compression fracture, discitis, paraspinal   mass, or paraspinal fluid collection.   2.  Thoracic spondylosis with multiple levels of mild spinal and foraminal   stenosis     Lumbar spine:   1.  Moderate chronic superior endplate compression fracture deformity of   L3 status post vertebroplasty. No acute compression fracture deformity.   2.  No evidence of discitis, paraspinal mass, or paraspinal fluid   collection   3. Multilevel degenerative change throughout the lumbar spine with   multilevel central spinal stenosis mild central spinal stenosis. There is   multilevel foraminal stenosis, greatest at L5-S1 on the left where it is   marked     Lumbar radiculopathy  -plan for left L5-S1 TFESI on Tuesday 4/1  -no need to be NPO  -pt is off all abx and not currently being treated for infection   -hold heparin starting with 0600 dose on 4/1.  Can resume 6hr post injection     This patient's clinical history, exam, AND imaging support radiculopathy AND there is a significant impact on quality of life and function AND the pain has been present for at least 4 weeks AND they have failed to improve with noninvasive conservative care.             Patient can follow up as an outpatient in the [facility] for ongoing evaluation and treatment by calling [phone]  Thank you for this consult. Please page with questions.    Lona Millard, APRN-NP  Interventional Pain Consult Pager [phone]  Department of Anesthesiology   [facility]  ______________________________________________________________________    History of Present Illness:   Cheyenne Ayers is a 78 y.o. 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 Presented on 3/29 for evaluation and management of acute on chronic back pain.  Neurosurgery recommending left L5-S1 TFESI.    She describes the pain as follows:  -pain started worsening in the past 3-4 days   -it is  localized to posterolateral LLE  -numbness/tingling: none  -initial inciting injury or event: none  -pain is constant  -the pain is described as nerve pain  -alleviating factors: pain medication  -aggravating factors: walking  -the pain radiates: Yes , down posterolateral LLE  -VAS 8/10  -loss of bowel or bladder control: No  -unexplained weight loss: No  -experiencing weakness: No     PRIOR MEDICATIONS:   Effective  Hydrocodone  Dilaudid    Ineffective      Unable to tolerate      Never  Gabapentin   Lyrica  Ami/Nortriptyline  Cymbalta  Tizanidine  NSAID  Acetaminophen    PRIOR INTERVENTIONS:   Effective  L3 kyphoplasty    Ineffective:      Prior surgery:      Prior injections:      Past Medical History:  Past Medical History:    Bipolar disorder (CMS-HCC)    Carotid stenosis    Chest pressure    Chronic back pain    Chronic pain    GERD (gastroesophageal reflux disease)    HTN (hypertension)    Hyperlipidemia    Migraine    Non compliance w medication regimen    Post-polio syndrome (CMS-HCC)       Family History:  Family History   Problem Relation Name Age of Onset    Cancer Mother      Stroke Father         Social History:  Lives in [place] [address]    Social History     Socioeconomic History    Marital status: Widowed    Number of children: 1   Tobacco Use    Smoking status: Former     Current packs/day: 0.00     Types: Cigarettes     Quit date: 10/29/1984     Years since quitting: 39.4    Smokeless tobacco: Never   Substance and Sexual Activity    Alcohol use: Not Currently     Comment: rarely    Drug use: No       Allergies:  Allergies   Allergen Reactions    Calcium Channel Blocking Agent Diltiazem Analogues ANAPHYLAXIS    Nitrous Oxide SHORTNESS OF BREATH    Sulfa Dyne EDEMA    Ambien [Zolpidem] SEE COMMENTS     Memory loss    Halcion [Triazolam] SEE COMMENTS     Memory loss    Lincocin [Lincomycin] UNKNOWN    Stadol [Butorphanol Tartrate] NAUSEA AND VOMITING       Medications:    Current Facility-Administered Medications:     dexamethasone PF (DECADRON) epidural injection 10 mg, 10 mg, Epidural, ONCE, Philomena Course, MD    iohexoL (OMNIPAQUE-240) 240 mg/mL injection 2.5 mL, 2.5 mL, Epidural, ONCE, Philomena Course, MD  No current outpatient medications on file.    Facility-Administered Medications Ordered in Other Visits:     ALPRAZolam (XANAX) tablet 2 mg, 2 mg, Oral, QHS PRN, Gilman Schmidt, MD    amitriptyline/gabapentin/baclofen(#) 2/6/2 % topical cream, , Topical, TID PRN, Gilman Schmidt, MD    citalopram (CeleXA) tablet 40 mg, 40 mg, Oral, QDAY, Czaniecki, Tyler, MD, 40 mg at 03/27/24 2201 cyclobenzaprine (FLEXERIL) tablet 5 mg, 5 mg, Oral, TID PRN, Gilman Schmidt, MD, 5 mg at 03/26/24 0747    [Held by Provider] heparin (porcine) PF syringe 5,000 Units, 5,000 Units, Subcutaneous, Q8H, Czaniecki, Tyler, MD    HYDROcodone/acetaminophen (NORCO) 5/325 mg tablet 1 tablet, 1 tablet, Oral, Q4H PRN,  [place], MD, 1 tablet at 03/28/24 5573    HYDROmorphone injection (DILAUDID) 1 mg, 1 mg, Intravenous, Q4H PRN, [place], MD, 1 mg at 03/28/24 1000    lamoTRIgine (LaMICtal) tablet 200 mg, 200 mg, Oral, QDAY, Gilman Schmidt, MD, 200 mg at 03/27/24 2202    loratadine (CLARITIN) tablet 10 mg, 10 mg, Oral, QDAY, Gilman Schmidt, MD, 10 mg at 03/27/24 2202    melatonin tablet 5 mg, 5 mg, Oral, QHS PRN, Gilman Schmidt, MD    ondansetron (ZOFRAN ODT) rapid dissolve tablet 4 mg, 4 mg, Oral, Q6H PRN, 4 mg at 03/27/24 0902 **OR** ondansetron HCL (PF) (ZOFRAN (PF)) injection 4 mg, 4 mg, Intravenous, Q6H PRN, Gilman Schmidt, MD, 4 mg at 03/27/24 1853    polyethylene glycol 3350 (MIRALAX) packet 17 g, 1 packet, Oral, QDAY PRN, Gilman Schmidt, MD    rOPINIRole (REQUIP) tablet 3 mg, 3 mg, Oral, QHS, Czaniecki, Tyler, MD, 3 mg at 03/27/24 2201    rosuvastatin (CRESTOR) tablet 10 mg, 10 mg, Oral, QHS, Czaniecki, Tyler, MD, 10 mg at 03/27/24 2202    sennosides-docusate sodium (SENOKOT-S) tablet 1 tablet, 1 tablet, Oral, QDAY PRN, Gilman Schmidt, MD    tiZANidine (ZANAFLEX) tablet 2 mg, 2 mg, Oral, Q8H PRN, Gilman Schmidt, MD, 2 mg at 03/27/24 2201    traZODone (DESYREL) tablet 200 mg, 200 mg, Oral, QHS, Czaniecki, Joselyn Glassman, MD, 200 mg at 03/27/24 2202    Review of Systems:  Jerusalen Mateja denies any recent fevers, chills, infection, antibiotics, bowel or bladder incontinence, saddle anesthesia, bleeding issues, or recent anticoagulant.  All 14 systems reviewed and found to be negative except as above and as follows.    Vital Signs:  Last Filed in 24 hours Vital Signs:  24 hour Range    BP: (P) 164/73 (03/31 0928)  Temp: (P) 36.8 ?C (98.3 ?F) (03/31 2202)  Pulse: (P) 85 (03/31 0928)  Respirations: (P) 18 PER MINUTE (03/31 0928)  SpO2: (P) 96 % (03/31 0928)  O2 Device: (P) None (Room air) (03/31 0928) BP: (141-177)/(59-78)   Temp:  [36.7 ?C (98.1 ?F)-37.2 ?C (99 ?F)]   Pulse:  [78-96]   Respirations:  [16 PER MINUTE-18 PER MINUTE]   SpO2:  [96 %-100 %]   O2 Device: (P) None (Room air)     Physical Exam:  General: The patient is a well-developed, well nourished 78 y.o. female in no acute distress.   HEENT: Head is normocephalic and atraumatic. Pupils are equal and reactive to light bilaterally.   Cardiac: Based on palpation, pulse appears to be regular rate and rhythm.   Pulmonary: The patient has unlabored respirations and bilateral symmetric chest excursion.   Abdomen: Soft, nontender, and nondistended. There is no rebound or guarding.   Extremities: No clubbing, cyanosis, or edema.   Skin: Warm and dry to touch.  MS: TTP low back with bands of muscle tightness.  Moves extremities spontaneously  Neurologic:   The patient is alert and oriented times 3.   Cranial nerves II through XII intact      Lumbar spine:  Appearance: No lesions or deformity  Lumbar tenderness: Yes  Sensation to light touch: Intact and equal in the bilateral lower extremities  Straight leg raise positive?: Left    Lower extremity strength:   Root Right Left   Hip Flexion L2 5 4   Knee Flexion L5/S1 5 4   Knee Extension L3 5 4   Dorsiflexion L4 5 5   Plantarflexion S1 5  5   EHL Extension L5 5 5        Results for orders placed during the hospital encounter of 03/25/24    MRI L-SPINE WO/W CONTRAST    Narrative  EXAM: MRI THORACIC AND LUMBAR SPINE    HISTORY: Back pain, history of thoracic injections.    Technique: Multiple sagittal and axial MR sequences were obtained of the thoracic and lumbar spine with and without contrast.    Comparison: CT abdomen/pelvis 03/26/2024    FINDINGS:    Thoracic spine:    The thoracic spine alignment is normal. No evidence of acute or recent spinal compression fracture. Mild degenerative endplate marrow signal changes at some levels. No aggressive or destructive geographic marrow replacing lesion. No paraspinal mass.    The disc signal is unremarkable throughout the thoracic spine. There is no evidence of discitis.    The thoracic cord is normal in signal and caliber without abnormal cord lesion. There is no abnormal cord enhancement or abnormal intrathecal enhancement    Asymmetric disc protrusion in the left subarticular zone at T8/T9 with mild central canal stenosis, eccentric to the left. No obvious significant associated foraminal stenosis    At T7-8 and T9-10 there are posterior element osteophytes or ligamentous thickening which mildly efface the posterior aspect of the thecal sac causing mild spinal stenosis    Small disc protrusion at T10-11 with some posterior element hypertrophy or ligamentous thickening resulting in mild stenosis. There is mild right neural foraminal stenosis.    Ligamentum flavum hypertrophy and facet arthropathy at T11-T12 with mild spinal and right foraminal stenosis    Lumbar spine:    Moderate chronic superior endplate compression fracture deformity of L3 status post vertebroplasty. No acute compression fracture deformity. Trace grade 1 anterolisthesis at L5-S1.    Unremarkable marrow signal without aggressive or destructive geographic marrow replacing lesion. No paraspinal mass or fluid collection. No evidence of discitis.    The conus is normal in appearance and position with termination at the L1 level. There is no abnormal intrathecal enhancement.    The level by level description is as follows:    T12-L1: Posterior disc protrusion within the right subarticular zone, facet hypertrophy and ligamentum flavum hypertrophy contributes to mild central spinal stenosis, mild right lateral recess stenosis, and moderate right and mild left foraminal stenosis.    L1-L2: Circumferential disc bulge, facet hypertrophy, and ligamentum flavum flavum hypertrophy contribute to mild bilateral neural foraminal stenosis. No significant central spinal stenosis.    L2-L3: Circumferential disc bulge, facet hypertrophy, and ligamentum flavum flavum hypertrophy contribute to mild central spinal stenosis. Mild bilateral lateral recess stenosis, and mild bilateral foraminal stenosis.    L3-L4: Circumferential disc bulge, ligamentum flavum hypertrophy, and facet hypertrophy contribute to mild spinal stenosis, mild bilateral lateral recess stenosis, and mild bilateral foraminal stenosis    L4-L5: Ligamentum flavum and facet hypertrophy with mild left neural foraminal stenosis. No significant central or right neural foraminal stenosis.    L5-S1: Grade 1 anterolisthesis, circumferential disc bulge, ligamentum flavum and facet hypertrophy contribute to mild spinal stenosis. There is mild right and marked left foraminal stenosis    Impression  Thoracic spine:    1.  No MRI evidence of acute compression fracture, discitis, paraspinal mass, or paraspinal fluid collection.  2.  Thoracic spondylosis with multiple levels of mild spinal and foraminal stenosis    Lumbar spine:    1.  Moderate chronic superior endplate compression fracture deformity of L3 status post vertebroplasty. No  acute compression fracture deformity.  2.  No evidence of discitis, paraspinal mass, or paraspinal fluid collection  3.  Multilevel degenerative change throughout the lumbar spine with multilevel central spinal stenosis mild central spinal stenosis. There is multilevel foraminal stenosis, greatest at L5-S1 on the left where it is marked    By my electronic signature, I attest that I have personally reviewed the images for this examination and formulated the interpretations and opinions expressed in this report      Finalized by Marily Memos, M.D. on 03/26/2024 1:46 PM. Dictated by Dalphine Handing, MD on 03/26/2024 12:40 PM.      Results for orders placed during the hospital encounter of 03/25/24    MRI T-SPINE WO/W CONTRAST    Narrative  EXAM: MRI THORACIC AND LUMBAR SPINE    HISTORY: Back pain, history of thoracic injections.    Technique: Multiple sagittal and axial MR sequences were obtained of the thoracic and lumbar spine with and without contrast.    Comparison: CT abdomen/pelvis 03/26/2024    FINDINGS:    Thoracic spine:    The thoracic spine alignment is normal. No evidence of acute or recent spinal compression fracture. Mild degenerative endplate marrow signal changes at some levels. No aggressive or destructive geographic marrow replacing lesion. No paraspinal mass.    The disc signal is unremarkable throughout the thoracic spine. There is no evidence of discitis.    The thoracic cord is normal in signal and caliber without abnormal cord lesion. There is no abnormal cord enhancement or abnormal intrathecal enhancement    Asymmetric disc protrusion in the left subarticular zone at T8/T9 with mild central canal stenosis, eccentric to the left. No obvious significant associated foraminal stenosis    At T7-8 and T9-10 there are posterior element osteophytes or ligamentous thickening which mildly efface the posterior aspect of the thecal sac causing mild spinal stenosis    Small disc protrusion at T10-11 with some posterior element hypertrophy or ligamentous thickening resulting in mild stenosis. There is mild right neural foraminal stenosis.    Ligamentum flavum hypertrophy and facet arthropathy at T11-T12 with mild spinal and right foraminal stenosis    Lumbar spine:    Moderate chronic superior endplate compression fracture deformity of L3 status post vertebroplasty. No acute compression fracture deformity. Trace grade 1 anterolisthesis at L5-S1.    Unremarkable marrow signal without aggressive or destructive geographic marrow replacing lesion. No paraspinal mass or fluid collection. No evidence of discitis.    The conus is normal in appearance and position with termination at the L1 level. There is no abnormal intrathecal enhancement.    The level by level description is as follows:    T12-L1: Posterior disc protrusion within the right subarticular zone, facet hypertrophy and ligamentum flavum hypertrophy contributes to mild central spinal stenosis, mild right lateral recess stenosis, and moderate right and mild left foraminal stenosis.    L1-L2: Circumferential disc bulge, facet hypertrophy, and ligamentum flavum flavum hypertrophy contribute to mild bilateral neural foraminal stenosis. No significant central spinal stenosis.    L2-L3: Circumferential disc bulge, facet hypertrophy, and ligamentum flavum flavum hypertrophy contribute to mild central spinal stenosis. Mild bilateral lateral recess stenosis, and mild bilateral foraminal stenosis.    L3-L4: Circumferential disc bulge, ligamentum flavum hypertrophy, and facet hypertrophy contribute to mild spinal stenosis, mild bilateral lateral recess stenosis, and mild bilateral foraminal stenosis    L4-L5: Ligamentum flavum and facet hypertrophy with mild left neural foraminal stenosis. No significant central or right neural foraminal  stenosis.    L5-S1: Grade 1 anterolisthesis, circumferential disc bulge, ligamentum flavum and facet hypertrophy contribute to mild spinal stenosis. There is mild right and marked left foraminal stenosis    Impression  Thoracic spine:    1.  No MRI evidence of acute compression fracture, discitis, paraspinal mass, or paraspinal fluid collection.  2.  Thoracic spondylosis with multiple levels of mild spinal and foraminal stenosis    Lumbar spine:    1.  Moderate chronic superior endplate compression fracture deformity of L3 status post vertebroplasty. No acute compression fracture deformity.  2.  No evidence of discitis, paraspinal mass, or paraspinal fluid collection  3.  Multilevel degenerative change throughout the lumbar spine with multilevel central spinal stenosis mild central spinal stenosis. There is multilevel foraminal stenosis, greatest at L5-S1 on the left where it is marked    By my electronic signature, I attest that I have personally reviewed the images for this examination and formulated the interpretations and opinions expressed in this report      Finalized by Marily Memos, M.D. on 03/26/2024 1:46 PM. Dictated by Dalphine Handing, MD on 03/26/2024 12:40 PM.      Lab/Radiology/Other Diagnostic Tests:  Hematology:    Lab Results   Component Value Date    HGB 8.9 03/27/2024    HGB 11.6 11/05/2018    HCT 26.2 03/27/2024    HCT 34.2 11/05/2018    PLTCT 225 03/27/2024    PLTCT 327 11/05/2018    WBC 6.60 03/27/2024    WBC 10.3 11/05/2018    NEUT 74.5 03/27/2024    NEUT 72 11/04/2018    ANC 4.90 03/27/2024    ANC 9.30 11/04/2018    ALC 1.10 03/27/2024    ALC 2.90 11/04/2018    MONA 6.8 03/27/2024    MONA 6 11/04/2018    AMC 0.40 03/27/2024    AMC 0.80 11/04/2018    EOSA 0.7 03/27/2024    EOSA 0 11/04/2018    ABC 0.10 03/27/2024    ABC 0.00 11/04/2018    MCV 88.6 03/27/2024    MCV 87.3 11/05/2018    MCH 30.0 03/27/2024    MCH 29.5 11/05/2018    MCHC 33.9 03/27/2024    MCHC 33.8 11/05/2018    MPV 7.0 03/27/2024    MPV 7.0 11/05/2018    RDW 14.2 03/27/2024    RDW 14.6 11/05/2018   , Coagulation:  No results found for: PT, PTT, INR, General Chemistry:    Lab Results   Component Value Date    NA 138 03/27/2024    NA 140 11/04/2018    K 3.8 03/27/2024    K 3.2 11/04/2018    CL 106 03/27/2024    CL 107 11/04/2018    CO2 23 03/27/2024    CO2 20 11/04/2018    GAP 9 03/27/2024    GAP 13 11/04/2018    BUN 31 03/27/2024    BUN 22 11/04/2018    CR 2.56 03/27/2024    CR 1.24 11/04/2018    GLU 136 03/27/2024    GLU 125 11/04/2018    CA 9.4 03/27/2024    CA 9.1 11/04/2018    ALBUMIN 4.1 03/27/2024    ALBUMIN 4.1 11/04/2018    MG 1.9 03/27/2024    MG 2.3 11/04/2018    TOTBILI 0.4 03/27/2024    TOTBILI 0.2 11/04/2018    PO4 3.4 03/27/2024    PO4 3.0 11/04/2018   , Basic Chemistry:   Lab Results Component Value Date  NA 138 03/27/2024    NA 140 11/04/2018    K 3.8 03/27/2024    K 3.2 11/04/2018    CL 106 03/27/2024    CL 107 11/04/2018    CO2 23 03/27/2024    CO2 20 11/04/2018    BUN 31 03/27/2024    BUN 22 11/04/2018    CR 2.56 03/27/2024    CR 1.24 11/04/2018    GLU 136 03/27/2024    GLU 125 11/04/2018    CA 9.4 03/27/2024    CA 9.1 11/04/2018

## 2024-03-28 NOTE — Progress Notes
 OCCUPATIONAL THERAPY  ASSESSMENT NOTE      Name: Cheyenne Ayers   MRN: [id number]     DOB: 07/27/1946      Age: 78 y.o.  Admission Date: 03/25/2024     LOS: 2 days     Date of Service: 03/28/2024      Mobility  Patient Turn/Position: Self;Supine  Mobility Level [facility] Highest Level of Mobility (JH-HLM): Walk 25 feet or more (to doorway/hallway)  Distance Walked (feet): 75 ft  Level of Assistance: Stand by assistance  Assistive Device: None  Activity Limited By: Pain;Dizziness    Subjective  Reason for Admission and Past Medical Hx: Cheyenne Ayers is a 78 y.o. female with significant history of CKD IV/V, anemia of chronic disease, HTN, HLD, bipolar disorder on chronic benzodiazepines, chronic pain on chronic opioids and muscle relaxants, severe OA who presented to the hospital for C/C of  acute on chronic back pain. Admitted to medicine for further management.  Mental / Cognitive: Alert;Oriented;Cooperative;Follows commands  Pain: Complains of pain  Pain level: Before activity;6/10;During activity (has been dropping; does not rate with activity)  Pain Location: Back;Left;Leg  Persons Present: Provider    Home Living Situation  Lives With: Alone;Receives assistance from friends/neighbors  Type of Home: House  Entry Stairs: 1  In-Home Stairs: Able to live on main level (upstairs but pt does not have to access)  Bathroom Setup:  (walk in tub with a door)  Patient Owned Equipment: Environmental consultant with wheels  Comments: Pt was attending outpatient surgery after recent R shoulder surgery and plans to resume soon    Prior Level of Function  Level Of Independence: Independent with ADL and community mobility without device  Comments: began using walker with onset of pain  History of Falls in Past 3 Months: Yes    ADL's  Where Assessed: Edge of Bed  LE Dressing Assist: Stand By Assist  LE Dressing Deficits: Setup;Don/Doff R Shoe;Don/Doff L Shoe (slide on shoes)    ADL Mobility  Bed Mobility: Supine to Sit: Standby assist  Bed Mobility: Sit to Supine: Standby assist  Transfer Type: Sit to/from stand  Transfer: Assistance Level: To/from;Bed;Standby assist  Transfer: Assistive Device: None  Transfer: Type of Assistance: For safety considerations  End of Activity Status: In bed;Instructed patient to use call light;Nursing notified  Sitting Balance: Static sitting balance;Independent;Dynamic sitting balance;Standby assist  Standing Balance: Static standing balance;Dynamic standing balance;No UE support;Standby assist  Gait Distance: 75 feet  Gait: Assistance Level: Standby assist  Gait: Assistive Device: None  Gait Comments: One seated rest break due to increase in pain with mobility. Pt reports minor dizziness towards end of mobility    Activity Tolerance  Endurance: 2/5 Tolerates 10-20 Minutes Exercise w/Multiple Rests    Cognition  Overall Cognitive Status: WNL  Comprehension: WFL to Adequately Complete Self Care Tasks Safely  Expression: WFL Adequate to Meet Daily Needs  Social Interaction: Interacts in a Spontaneous,Cooperative Manner  Memory: WFL Adequate to Recall Day to Day Activities  Attention: Awake/Alert    Education  Persons Educated: Patient  Teaching Methods: Verbal Instruction  Patient Response: Verbalized Understanding  Topics: Role of OT, Goals for Therapy  Goal Formulation: With Patient    Assessment  Assessment: Decreased ADL Status;Decreased Endurance;Decreased High-Level ADLs;Decreased Self-Care Trans  Prognosis: Good  Goal Formulation: Patient    AM-PAC 6 Clicks Daily Activity Inpatient  Putting on and taking off regular lower body clothes: A Little  Bathing (Including washing, rinsing, drying): A Little  Toileting, which includes using toilet, bedpan, or urinal: A Little  Putting on and taking off regular upper body clothing: None  Taking care of personal grooming such as brushing teeth: None  Eating meals: None  Daily Activity Raw Score: 21  Standardized (T-scale) Score: 44.27    Plan  OT Frequency: Follow-Up Visit Only  OT Plan for Next Visit: LB dressing with pants, toileting, progress mobility    ADL Goals  Patient Will Perform All ADL's: w/ Stand By Assist    Functional Transfer Goals  Pt Will Perform All Functional Transfers: w/ Stand By Assist    OT Discharge Recommendations  Recommendation: Home with intermittent supervision/assistance  Recommendation for Therapy Post Discharge:  (Resume outpatient therapy for R shoulder)  Patient Currently Requires Equipment: Owns what is needed      Therapist: Aubery Leader, OTD, OTR/L [id number]  Date: 03/28/2024

## 2024-03-28 NOTE — Progress Notes

## 2024-03-28 NOTE — Unmapped
 Patient/family declined:  Bed/chair alarm and W/in arms reach during toileting/showering.    Patient/family educated on importance of intervention to their safety/quality of care. Patient/family continues to decline care.    Reason Why Patient/Family declined:  Able to ambulate independently.    Individualized safety and/or care plan implemented. If additional safety measures implemented, please list them.     Escalated to:  Unit coordinator/charge nurse.

## 2024-03-28 NOTE — Patient Instructions
Procedure Completed Today: Lumbar Transforaminal Steroid Injection    Important information following your procedure today: You may drive today    Pain relief may not be immediate. It is possible you may even experience an increase in pain during the first 24-48 hours followed by a gradual decrease of your pain.  Though the procedure is generally safe and complications are rare, we do ask that you be aware of any of the following:   Any swelling, persistent redness, new bleeding, or drainage from the site of the injection.  You should not experience a severe headache.  You should not run a fever over 101? F.  New onset of sharp, severe back & or neck pain.  New onset of upper or lower extremity numbness or weakness.  New difficulty controlling bowel or bladder function after the injection.  New shortness of breath.    If any of these occur, please call to report this occurrence to Dr. Evelena Leyden at [phone]. If you are calling after 4:00 p.m., on weekends or holidays please call [phone] and ask to have the resident physician on call for the physician paged or go to your local emergency room.  You may experience soreness at the injection site. Ice can be applied at 20 minute intervals. Avoid application of direct heat, hot showers or hot tubs today.  Avoid strenuous activity today. You may resume your regular activities and exercise tomorrow.  Patients with diabetes may see an elevation in blood sugars for 7-10 days after the injection. It is important to pay close attention to your diet, check your blood sugars daily and report extreme elevations to the physician that treats your diabetes.  Patients taking a daily blood thinner can resume their regular dose this evening.  It is important that you take all medications ordered by your pain physician. Taking medication as ordered is an important part of your pain care plan. If you cannot continue the medication plan, please notify the physician.     Possible side effects to steroids that may occur:  Flushing or redness of the face  Irritability  Fluid retention  Change in women?s menses    The following medications were used: Lidocaine  and Decadron

## 2024-03-28 NOTE — Progress Notes
 Patient/family declined:  Other: pt is refusing vitals on the night of 4-1 .    Patient/family educated on importance of intervention to their safety/quality of care. Patient/family continues to decline care.    Reason Why Patient/Family declined:  pt states they have not been able to sleep during their entire stay and wants a good night sleep.    Individualized safety and/or care plan implemented. If additional safety measures implemented, please list them.     Escalated to:  Unit coordinator/charge nurse.

## 2024-03-28 NOTE — Progress Notes
 Internal Medicine Daily Progress Note      Patient's Name:  Cheyenne Ayers MRN: [id number]   Today's Date:  03/28/2024  Admission Date: 03/25/2024  LOS: 2 days    Problem list  Principal Problem:    Back pain, lumbosacral  Active Problems:    Left hip pain    Acute on chronic back pain    Lumbar radiculopathy    Brief Hospital Course     Cheyenne Ayers is a 78 y.o. female with significant history of CKD IV/V, anemia of chronic disease, HTN, HLD, bipolar disorder on chronic benzodiazepines, chronic pain on chronic opioids and muscle relaxants, severe OA who presented to the hospital for C/C of  acute on chronic back pain. Admitted to medicine for further management.       Interval Updates - Today 03/28/2024:  > TFESI at L5-S1 planned for today with interventional pain  >Norco decreased to 5 mg q4h given nausea with 10 mg q6h, IV dilaudid 1 mg q4h  >Possible discharge on 4/2    Assessment and Plan      #Acute on Chronic LBP:  #Sciatica:  #Severe OA s/p bilateral knee, bilateral hip, right shoulder replacements:  #Difficult ambulating 2/2 to pain:  Reportedly slept on hard mattress without her typical mattress cover, woke up in the morning with severe left sided lower back, hip, radiating down left leg, unrelieved by home Norco. No red flag symptoms. Lives alone at home and having difficulty ambulating without assistance 2/2 to pain.   *CTAP:  1.  No acute abdominopelvic inflammatory mass, small bowel obstruction, or ascites.   2. Mild cutaneous thickening and fat stranding in the left lower flank, may reflect contusion or mild fat necrosis, correlation for cellulitis needed.   3. Distal colonic diverticulosis.   -MRI T and L spine: thoracic spondylosis with multiple levels of mild spinal and foraminal stenosis, moderate chronic superior endplate compressions fracture deformity of L3 s/p vertebroplasty, no acute compression fracture deformity, multilevel degenerative change throughout the lumbar spine with multilevel central spinal stenosis with mild spinal stenosis, multilevel foraminal stenosis- greatest at L5-S1 on the L side   PLAN  -Multimodal pain regimen limited by kidney disease               -Tylenol ATC, flexeril PRN, amitriptyline/gabapentin/baclofen cream               -Norco decreased to 5 mg q4h given nausea with 10 mg q6h, IV dilaudid 1 mg q4h               -Lidocaine patch               -Heat compress  -PT/OT consulted  -Neurosurgery spine consulted: No acute surgical infervention, consider pain medicine consult for L TFESI at L5-S1, follow up as an outpatient in 6 weeks, signed off  -TFESI at L5-S1 planned for today with interventional pain     #Asymptomatic pyuria/bacteriuria:  *UA 11-20 wbc, few bacter  -s/p ceftriaxone x1 in ED, will hold off further antibiotics as asymptomatic right now  -UC= NGTD     #Bipolar II:  #Chronic Benzo Use:  -C/w PTA lamictal, citalopram, trazodone, requip  -C/w PTA xanax qhs PRN     #CKD 4-5:  *Cr 2.6 (3.05 on 3/14)  -Pending evaluation as outpatient for peritoneal dialysis  -Trend Cr  -Avoid nephrotoxic agents    FEN:  > DIET REGULAR  > IVF: None    Prophylaxis Review:  VTE  ppx: Heparin-patient refusing  GI ppx: Not indicated  Last bowel movement:  (pta)    Code Status:  Full Code  Disposition: Admit to Med 1        PT recommendation: pending      Seen and discussed with Dr. Sanjuan Dame, MD     [place], MD      Subjective:     Patient seen resting in bed this morning. Reports pain is improved from previous day but still has a good amount of pain. Reports being able to walk to the bathroom which was an improvement for her.       Objective:       BP: (147-181)/(63-76)   Temp:  [36.6 ?C (97.9 ?F)-37.2 ?C (99 ?F)]   Pulse:  [79-99]   Respirations:  [18 PER MINUTE]   SpO2:  [96 %-98 %]   O2 Device: None (Room air)    Physical Exam  Constitutional:       Appearance: Normal appearance.   HENT:      Head: Normocephalic.      Mouth/Throat:      Mouth: Mucous membranes are moist.      Pharynx: Oropharynx is clear.   Eyes:      Conjunctiva/sclera: Conjunctivae normal.      Pupils: Pupils are equal, round, and reactive to light.   Cardiovascular:      Rate and Rhythm: Normal rate and regular rhythm.      Heart sounds: Murmur heard.   Pulmonary:      Effort: Pulmonary effort is normal. No respiratory distress.      Breath sounds: No wheezing or rhonchi.   Abdominal:      General: Abdomen is flat. Bowel sounds are normal.      Tenderness: There is no abdominal tenderness. There is no guarding.   Musculoskeletal:         General: Tenderness (Tenderness in L buttock region and lumbar paraspinal muscles) present.      Cervical back: Normal range of motion.      Right lower leg: No edema.      Left lower leg: No edema.      Comments: Positive straight leg test   Skin:     General: Skin is warm and dry.      Capillary Refill: Capillary refill takes less than 2 seconds.   Neurological:      General: No focal deficit present.      Mental Status: She is alert and oriented to person, place, and time. Mental status is at baseline.      Cranial Nerves: No cranial nerve deficit.      Sensory: No sensory deficit.   Psychiatric:         Mood and Affect: Mood normal.         Thought Content: Thought content normal.              Intake/Output Summary (Last 24 hours) at 03/28/2024 0838  Last data filed at 03/28/2024 0354  Gross per 24 hour   Intake 240 ml   Output 0 ml   Net 240 ml      Recent Labs     03/26/24  0058 03/27/24  0447 03/28/24  0432   HGB 9.0* 8.9* 8.9*   WBC 9.50 6.60 7.80   PLTCT 225 225 226   NA 138 138 135*   K 3.3* 3.8 4.1   CL 110 106 101   CO2 17* 23  23   BUN 36* 31* 28*   CR 2.67* 2.56* 2.42*   GLU 90 136* 146*   CA 8.5 9.4 9.0   MG  --  1.9 2.0   PO4  --  3.4 3.4   ALBUMIN 4.0 4.1 4.1   AST 11 13 13    ALT 5* 6* 6*   ALKPHOS 71 67 66   TOTBILI 0.3 0.4 0.4       No results for input(s): BNP, HIGHSTROPI, HSTROP0HR, HSTROP2HR, HSTROPDELTA in the last 72 hours.     No image results found.     Meds:  Scheduled Meds:citalopram (CeleXA) tablet 40 mg, 40 mg, Oral, QDAY  [Held by Provider] heparin (porcine) PF syringe 5,000 Units, 5,000 Units, Subcutaneous, Q8H  lamoTRIgine (LaMICtal) tablet 200 mg, 200 mg, Oral, QDAY  loratadine (CLARITIN) tablet 10 mg, 10 mg, Oral, QDAY  rOPINIRole (REQUIP) tablet 3 mg, 3 mg, Oral, QHS  rosuvastatin (CRESTOR) tablet 10 mg, 10 mg, Oral, QHS  traZODone (DESYREL) tablet 200 mg, 200 mg, Oral, QHS    Continuous Infusions:  PRN and Respiratory Meds:ALPRAZolam QHS PRN, amitriptyline/gabapentin/baclofen(#) TID PRN, cyclobenzaprine TID PRN, HYDROcodone/acetaminophen Q4H PRN, HYDROmorphone (DILAUDID) injection Q4H PRN, melatonin QHS PRN, ondansetron Q6H PRN **OR** ondansetron (ZOFRAN) IV Q6H PRN, polyethylene glycol 3350 QDAY PRN, sennosides-docusate sodium QDAY PRN, tiZANidine Q8H PRN      Wounds:        Nutrition:  Malnutrition Details:

## 2024-03-29 ENCOUNTER — Inpatient Hospital Stay: Admit: 2024-03-26 | Discharge: 2024-03-29

## 2024-03-29 ENCOUNTER — Encounter: Admit: 2024-03-29 | Discharge: 2024-03-29

## 2024-03-29 DIAGNOSIS — M549 Dorsalgia, unspecified: Secondary | ICD-10-CM

## 2024-03-29 DIAGNOSIS — M4317 Spondylolisthesis, lumbosacral region: Secondary | ICD-10-CM

## 2024-03-29 DIAGNOSIS — Z7982 Long term (current) use of aspirin: Secondary | ICD-10-CM

## 2024-03-29 DIAGNOSIS — D631 Anemia in chronic kidney disease: Secondary | ICD-10-CM

## 2024-03-29 DIAGNOSIS — G8929 Other chronic pain: Secondary | ICD-10-CM

## 2024-03-29 DIAGNOSIS — R8281 Pyuria: Secondary | ICD-10-CM

## 2024-03-29 DIAGNOSIS — I129 Hypertensive chronic kidney disease with stage 1 through stage 4 chronic kidney disease, or unspecified chronic kidney disease: Secondary | ICD-10-CM

## 2024-03-29 DIAGNOSIS — G14 Postpolio syndrome: Secondary | ICD-10-CM

## 2024-03-29 DIAGNOSIS — Z9049 Acquired absence of other specified parts of digestive tract: Secondary | ICD-10-CM

## 2024-03-29 DIAGNOSIS — Z809 Family history of malignant neoplasm, unspecified: Secondary | ICD-10-CM

## 2024-03-29 DIAGNOSIS — Z823 Family history of stroke: Secondary | ICD-10-CM

## 2024-03-29 DIAGNOSIS — N184 Chronic kidney disease, stage 4 (severe): Secondary | ICD-10-CM

## 2024-03-29 DIAGNOSIS — Z79891 Long term (current) use of opiate analgesic: Secondary | ICD-10-CM

## 2024-03-29 DIAGNOSIS — M47814 Spondylosis without myelopathy or radiculopathy, thoracic region: Secondary | ICD-10-CM

## 2024-03-29 DIAGNOSIS — M4856XD Collapsed vertebra, not elsewhere classified, lumbar region, subsequent encounter for fracture with routine healing: Secondary | ICD-10-CM

## 2024-03-29 DIAGNOSIS — F3181 Bipolar II disorder: Secondary | ICD-10-CM

## 2024-03-29 DIAGNOSIS — Z87891 Personal history of nicotine dependence: Secondary | ICD-10-CM

## 2024-03-29 DIAGNOSIS — M5416 Radiculopathy, lumbar region: Secondary | ICD-10-CM

## 2024-03-29 DIAGNOSIS — E785 Hyperlipidemia, unspecified: Secondary | ICD-10-CM

## 2024-03-29 DIAGNOSIS — K5732 Diverticulitis of large intestine without perforation or abscess without bleeding: Secondary | ICD-10-CM

## 2024-03-29 DIAGNOSIS — M48061 Spinal stenosis, lumbar region without neurogenic claudication: Secondary | ICD-10-CM

## 2024-03-29 DIAGNOSIS — Z91148 Patient's other noncompliance with medication regimen for other reason: Secondary | ICD-10-CM

## 2024-03-29 DIAGNOSIS — Z79899 Other long term (current) drug therapy: Secondary | ICD-10-CM

## 2024-03-29 DIAGNOSIS — Z90710 Acquired absence of both cervix and uterus: Secondary | ICD-10-CM

## 2024-03-29 DIAGNOSIS — K219 Gastro-esophageal reflux disease without esophagitis: Secondary | ICD-10-CM

## 2024-03-29 MED ORDER — HYDROCODONE-ACETAMINOPHEN 5-325 MG PO TAB
1 | Freq: Once | ORAL | 0 refills | Status: CP
Start: 2024-03-29 — End: ?
  Administered 2024-03-29: 15:00:00 1 via ORAL

## 2024-03-29 NOTE — Progress Notes
 OCCUPATIONAL THERAPY  PROGRESS/DISCHARGE NOTE      Name: Cheyenne Ayers   MRN: [id number]     DOB: 08/11/1946      Age: 78 y.o.  Admission Date: 03/25/2024     LOS: 3 days     Date of Service: 03/29/2024      Mobility  Patient Turn/Position: Supine;Self  Mobility Level [facility] Highest Level of Mobility (JH-HLM): Walk 25 feet or more (to doorway/hallway)  Distance Walked (feet): 25 ft  Level of Assistance: Stand by assistance  Assistive Device: None  Activity Limited By: Pain    Subjective  Reason for Admission and Past Medical Hx: Cheyenne Ayers is a 78 y.o. female with significant history of CKD IV/V, anemia of chronic disease, HTN, HLD, bipolar disorder on chronic benzodiazepines, chronic pain on chronic opioids and muscle relaxants, severe OA who presented to the hospital for C/C of acute on chronic back pain. Admitted to medicine for further management.  Mental / Cognitive: Alert;Oriented;Cooperative;Follows commands  Pain: Complains of pain  Pain level: Before activity;During activity;After activity;7/10 (pain rated for L hip)  Pain Location: Left;Hip;Right;Shoulder  Pain Interventions: Patient agrees to participate in therapy with current pain level;Patient assisted into position of comfort    Home Living Situation  Lives With: Alone;Receives assistance from friends/neighbors  Type of Home: House  Entry Stairs: 1  In-Home Stairs: Able to live on main level (upstairs but pt does not have to access)  Bathroom Setup:  (walk in tub with a door)  Patient Owned Equipment: Walker with wheels  Comments: Pt was attending outpatient surgery after recent R shoulder surgery and plans to resume soon    Prior Level of Function  Level Of Independence: Independent with ADL and community mobility without device  Comments: began using walker with onset of pain  History of Falls in Past 3 Months: Yes    ADL's  Where Assessed: In [employer]  Toileting Assist: Stand By Assist  Toileting Deficits: Clothing Management Up;Clothing Management Down;Perineal Hygiene  Comment: Pt denies concerns with ADLs    ADL Mobility  Bed Mobility: Sit to Supine: Independent  Bed Mobility Comments: sitting EOB at arrival  Transfer Type: Sit to/from stand  Transfer: Assistance Level: To/from;Bed;Toilet;Independent  Transfer: Assistive Device: None  End of Activity Status: In bed;Instructed patient to use call light;Nursing notified  Sitting Balance: Static sitting balance;Independent;Dynamic sitting balance;Standby assist  Standing Balance: Static standing balance;Dynamic standing balance;No UE support;Independent  Gait Distance: 25 feet  Gait: Assistance Level: Standby assist  Gait: Assistive Device: None  Gait Comments: Pt denies dizziess with mobility. Reports pain in L hip increases with ambulation. Pt declines further mobility at this time due to pain; however, reports no concerns with ambulation. States she will use walker if needed.     Activity Tolerance  Endurance: 3/5 Tolerates 25-30 Minutes Exercise w/Multiple Rests    Cognition  Comprehension: WFL to Adequately Complete Self Care Tasks Safely  Expression: WFL Adequate to Meet Daily Needs  Social Interaction: Interacts in a Spontaneous,Cooperative Manner  Attention: Awake/Alert  Cognition Comment: Pt reports being the in the hospital for 2 weeks. Pt was admitted to [facility] on 3/29.    UE Strength / Tone  Strength Comments: Minor tremors noted in UE and head    Education  Persons Educated: Patient  Teaching Methods: Verbal Instruction  Patient Response: Verbalized Understanding  Topics: Role of OT, Goals for Therapy  Goal Formulation: With Patient    Assessment  Assessment: Decreased ADL Status;Decreased  Endurance;Decreased High-Level ADLs;Decreased Self-Care Trans  Prognosis: Good  Goal Formulation: Patient  Comments: Patient tolerates session well this date and is able to complete in room mobility as well as toileting with standby assist without use of a device. Patient denies concerns with ADLs or functional mobility at this time and is mobilizing in room without RN staff assist at this time.    AM-PAC 6 Clicks Daily Activity Inpatient  Putting on and taking off regular lower body clothes: A Little  Bathing (Including washing, rinsing, drying): A Little  Toileting, which includes using toilet, bedpan, or urinal: None  Putting on and taking off regular upper body clothing: None  Taking care of personal grooming such as brushing teeth: None  Eating meals: None  Daily Activity Raw Score: 22  Standardized (T-scale) Score: 47.1    Plan  Progress: Discontinue OT  OT Frequency: No Further Treatment    ADL Goals  Patient Will Perform All ADL's: w/ Stand By Assist;Discontinued (Comment)    Functional Transfer Goals  Pt Will Perform All Functional Transfers: w/ Stand By Assist, Met    OT Discharge Recommendations  Recommendation: Home with intermittent supervision/assistance  Recommendation for Therapy Post Discharge:  (Resume outpatient therapy for R shoulder)  Patient Currently Requires Equipment: Owns what is needed      Therapist: Aubery Leader, OTD, OTR/L [id number]  Date: 03/29/2024

## 2024-03-29 NOTE — Progress Notes
 Discharge day progress note: Patient was seen and discussed with rounding internal medicine resident service - Med-1 who I am the attending for.     Subjective:  No events overnight.  This morning, feels ongoing pain in the back but better and able to stand up. Didn't feel she can ride in a car to go home in [place]. Discussed with CM/SW to arrange transport.   Denies any fever, chills, chest pain, dyspnea, nausea, vomiting, or leg swelling.        Objective:  24-hour vitals reviewed - stable.   alert and oriented, resting calmly in bed,   On exam, he is alert, no acute distress.    Heart with normal rate and regular rhythm, no murmurs.    Lungs clear to auscultation bilaterally.    Positive bowel sounds.  No abdominal tenderness to palpation.  Moves all 4 extremes without apparent deficit, and cranial nerves III through XII intact grossly.      No rashes on exposed skin.        Labs were reviewed   Lab Results   Component Value Date/Time    NA 133 (L) 03/29/2024 04:29 AM    K 4.0 03/29/2024 04:29 AM    CL 99 03/29/2024 04:29 AM    CO2 22 03/29/2024 04:29 AM    GAP 12 03/29/2024 04:29 AM     Lab Results   Component Value Date/Time    WBC 8.20 03/29/2024 04:29 AM    HGB 9.0 (L) 03/29/2024 04:29 AM    PLTCT 231 03/29/2024 04:29 AM        Assessment and plan:  A 78 y/o F from [place] [ PCP-Dr Leck]- HTN, HLD[Rosuvastatin, Bipolar d/o[ Lamictal + Citalopram + Trazodone + PRN Xanax], Restless leg[ Requip], Severe OA, Chronic pain[Norco], CKD-4 admitted on 03/25/24 through [facility] ED for Left hip pain radiating to leg + Acute on chronic low back pain. In ED had  CT A/P - mild cutaneous thickening and fat stranding in LL flank +  Distal colonic diverticulosis. Subsequently underwent MRI of Thoracic and lumbar spine that showed foraminal stenosis that was multilevel but greatest L5-S1. Orhtospine evaluated and recommended no acute surgical intervention but to follow with Dr Alma Friendly in 6 weeks as an outpatient and reccommended  Local treatment + Tylenol, Norco with transforminal epidural steroid injection of L5-S1 which was done on 03/28/24- she felt slightly better and was to follow as an outpatient. She also had symptomatic bacteruria that wasn't treated.    Medications reconciled - discussed with our pharmacist in team rounds  Huddle team discussion with SW and Case management      Medications      Medication List      CONTINUE taking these medications     ALPRAZolam 2 mg tablet; Commonly known as: XANAX; Dose: 2 mg; Refills: 0   amLODIPine 10 mg tablet; Commonly known as: NORVASC; Dose: 10 mg;   Refills: 0   aspirin 81 mg chewable tablet; Dose: 81 mg; Take 1 Tab by mouth daily.;   Quantity: 90 Tab; Refills: 3   CHOLEcalciferoL (vitamin D3) 50,000 units capsule; Commonly known as:   OPTIMAL D3; Dose: 50,000 Units; Refills: 0   citalopram 40 mg tablet; Commonly known as: CeleXA; Dose: 40 mg;   Refills: 0   CYCLOBENZAPRINE PO; Refills: 0   ferrous sulfate 325 mg (65 mg iron) tablet; Commonly known as: FEOSOL;   Dose: 325 mg; Take one tablet by mouth three times daily with meals. Take  on an empty stomach at least 1 hour before or 2 hours after food.;   Quantity: 90 tablet; Refills: 0   fexofenadine 180 mg tablet; Commonly known as: ALLEGRA; Dose: 180 mg;   Refills: 0   HYDROcodone/acetaminophen 5/325 mg tablet; Commonly known as: NORCO;   Dose: 1 tablet; Refills: 0   lamoTRIgine 200 mg tablet; Commonly known as: LaMICtal; Dose: 200 mg;   Refills: 0   lisinopril 5 mg tablet; Commonly known as: ZESTRIL; Dose: 5 mg; Refills:   0   MELATONIN PO; Dose: 20 mg; Refills: 0   omeprazole DR 40 mg capsule; Commonly known as: PriLOSEC; Dose: 40 mg;   Refills: 0   rOPINIRole 1 mg tablet; Commonly known as: REQUIP; Dose: 3 mg; Refills:   0   rosuvastatin 20 mg tablet; Commonly known as: CRESTOR; Dose: 10 mg;   Refills: 0   traZODone 100 mg tablet; Commonly known as: DESYREL; Dose: 300 mg;   Refills: 0              Melvinia Stager MD  Department of Internal Medicine, Hospitalist  [phone]

## 2024-03-29 NOTE — Progress Notes
 Pt was given her Norco pill in a med cup. Pt states she may need to break it in half to take it. Pt then states she dropped half of the pill. I was in the room but did not see patient drop the pill. RN looked in pt's bed and the surrounding floor area and was unable to locate the pill. Consulting civil engineer notified.

## 2024-03-29 NOTE — Progress Notes
 BH 45 END OF SHIFT/ JHFRAT NOTE    Admission Date: 03/25/2024    Acute events, interventions, provider communication: NA    Patient Interventions and Education  Fall Risk/JHFRAT Interventions and Education: (Charting when applicable)  Elimination Interventions : N/A  Medications : Use of gait belt   Patient Care Equipment: N/A  Mobility: Assist x1 and Gait belt in use when ambulating  Cognition: N/A  Risk for Moderate/Major Injury: Age: >65 yrs    2. Restraints:  No     Restraint Goal: Patient will be free from injury while physically restrained.  See Docflowsheet for restraint documentation, interventions, education, etc.    Intake and Output:       Intake/Output Summary (Last 24 hours) at 03/29/2024 1153  Last data filed at 03/29/2024 0418  Gross per 24 hour   Intake 0 ml   Output 0 ml   Net 0 ml              Last Bowel Movement Date: 03/28/24

## 2024-03-29 NOTE — Progress Notes
 [facility] 45 END OF SHIFT/ JHFRAT NOTE    Admission Date: 03/25/2024    Acute events, interventions, provider communication: N/A    Patient Interventions and Education  Fall Risk/JHFRAT Interventions and Education: (Charting when applicable)  Elimination Interventions : N/A  Medications : N/A  Patient Care Equipment: Does not need assistance with patient care equipment when ambulating and Assess need for patient equipment and remove if not in use  Mobility: N/A  Cognition: N/A  Risk for Moderate/Major Injury: Age: >65 yrs and Risk for fracture    2. Restraints:  No     Restraint Goal: Patient will be free from injury while physically restrained.  See Docflowsheet for restraint documentation, interventions, education, etc.    Intake and Output:       Intake/Output Summary (Last 24 hours) at 03/29/2024 0551  Last data filed at 03/29/2024 0418  Gross per 24 hour   Intake 0 ml   Output 0 ml   Net 0 ml              Last Bowel Movement Date: 03/28/24

## 2024-03-29 NOTE — Case Management (ED)
 Case Management Progress Note    NAME:Cheyenne Ayers                          MRN: [id number]              DOB:1946-04-03          AGE: 78 y.o.  ADMISSION DATE: 03/25/2024             DAYS ADMITTED: LOS: 3 days      Today's Date: 03/29/2024    PLAN: To discharge home today with no CM needs.     Expected Discharge Date: 03/29/2024 1:00 PM  Is Patient Medically Stable: Yes   Are there Barriers to Discharge? no    INTERVENTION/DISPOSITION:  Discharge Planning              Discharge Planning: Transportation Arrangements and/or Resources  PT/OT signed off. Recommending patient continue outpatient therapy for her shoulder. No issues with ambulation.   Patients own RW at bedside.   Transportation              Does the Patient Need Case Management to Arrange Discharge Transport? (ex: facility, ambulance, wheelchair/stretcher, Medicaid, cab, other): Yes  Type of Transport: Cab  Will the Patient Use Family Transport?: No  Patient will need a cab pass home. NCM tasked CMA to deliver long distance cab pass and notified primary RN.     Support               Reviewed EMR and attended huddle.     Info or Referral                 Positive SDOH Domains and Potential Barriers                   Medication Needs                                  Financial                 Legal                 Other                 Discharge Disposition                       Selected Continued Care - Admitted Since 03/25/2024    No services have been selected for the patient.       Suzzette Eth, RN, BSN Nurse Case Manager  Available on Voalte

## 2024-03-29 NOTE — Discharge Instructions - Pharmacy
 Discharge Summary      Name: Cheyenne Ayers  Medical Record Number: [id number]        Account Number:  [id number]  Date Of Birth:  03-06-1946                         Age:  78 y.o.  Admit date:  03/25/2024                     Discharge date: 03/29/2024      Discharge Attending:  Dr. Orson Ape  Discharge Summary Completed By: Harvest Forest, MD    Service: Med 1- 2042    Reason for hospitalization:  Back pain, lumbosacral [M54.50]    Primary Discharge Diagnosis:   Back pain, lumbosacral    Hospital Diagnoses:  Hospital Problems        Active Problems    * (Principal) Back pain, lumbosacral    Left hip pain    Acute on chronic back pain    Lumbar radiculopathy     Present on Admission:   Back pain, lumbosacral        Significant Past Medical History        Bipolar disorder (CMS-HCC)  Carotid stenosis  Chest pressure  Chronic back pain  Chronic pain  GERD (gastroesophageal reflux disease)  HTN (hypertension)  Hyperlipidemia  Migraine  Non compliance w medication regimen  Post-polio syndrome (CMS-HCC)    Allergies   Calcium channel blocking agent diltiazem analogues, Nitrous oxide, Sulfa dyne, Ambien [zolpidem], Halcion [triazolam], Lincocin [lincomycin], and Stadol [butorphanol tartrate]    Brief Hospital Course   The patient was admitted and the following issues were addressed during this hospitalization: (with pertinent details including admission exam/imaging/labs).      Cheyenne Ayers is a 78 y.o. female with significant history of CKD IV/V, anemia of chronic disease, HTN, HLD, bipolar disorder on chronic benzodiazepines, chronic pain on chronic opioids and muscle relaxants, severe OA who presented to the hospital for C/C of acute on chronic low back pain with left hip pain radiating down the left leg.  Patient had CTAP done showed mild cutaneous thickening and fat stranding in the left lower flank distal diverticulitis.  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  Neurosurgery spine evaluated and recommended no acute surgical intervention with plans to follow-up as an outpatient in 6 weeks.  They recommended consult to interventional pain which was made and interventional pain performed a L5-S1 TFESI on 4/1 with improvement of her pain.  Patient required IV pain medications during her hospitalization but her pain was adequately managed with her home regimen of Norco prior to discharge.  PT and OT saw the patient prior to discharge and recommended discharge home.    Of note the patient was found to have asymptomatic pyuria on admission but did not have any urinary symptoms and thus was not treated for UTI.    The rest of the patient's chronic medical problems were managed with her home medications.  Patient should follow-up with her primary care doctor and outpatient pain doctor.    Day of discharge exam notable for: No neurologic deficit, pain controlled on PTA meds    Items Needing Follow Up   Pending items or areas that need to be addressed at follow up:   -Follow up with PCP and outpatient pain doctor     Pending  Labs and Follow Up Radiology    Pending labs and/or radiology review at this time of discharge are listed below: Please note- any labs with collected status will not have a result; if this area is blank, there are no items for review.         Medications      Medication List      CONTINUE taking these medications     ALPRAZolam 2 mg tablet; Commonly known as: XANAX; Dose: 2 mg; Refills: 0   amLODIPine 10 mg tablet; Commonly known as: NORVASC; Dose: 10 mg;   Refills: 0   aspirin 81 mg chewable tablet; Dose: 81 mg; Take 1 Tab by mouth daily.;   Quantity: 90 Tab; Refills: 3   CHOLEcalciferoL (vitamin D3) 50,000 units capsule; Commonly known as:   OPTIMAL D3; Dose: 50,000 Units; Refills: 0   citalopram 40 mg tablet; Commonly known as: CeleXA; Dose: 40 mg;   Refills: 0   CYCLOBENZAPRINE PO; Refills: 0   ferrous sulfate 325 mg (65 mg iron) tablet; Commonly known as: FEOSOL;   Dose: 325 mg; Take one tablet by mouth three times daily with meals. Take   on an empty stomach at least 1 hour before or 2 hours after food.;   Quantity: 90 tablet; Refills: 0   fexofenadine 180 mg tablet; Commonly known as: ALLEGRA; Dose: 180 mg;   Refills: 0   HYDROcodone/acetaminophen 5/325 mg tablet; Commonly known as: NORCO;   Dose: 1 tablet; Refills: 0   lamoTRIgine 200 mg tablet; Commonly known as: LaMICtal; Dose: 200 mg;   Refills: 0   lisinopril 5 mg tablet; Commonly known as: ZESTRIL; Dose: 5 mg; Refills:   0   MELATONIN PO; Dose: 20 mg; Refills: 0   omeprazole DR 40 mg capsule; Commonly known as: PriLOSEC; Dose: 40 mg;   Refills: 0   rOPINIRole 1 mg tablet; Commonly known as: REQUIP; Dose: 3 mg; Refills:   0   rosuvastatin 20 mg tablet; Commonly known as: CRESTOR; Dose: 10 mg;   Refills: 0   traZODone 100 mg tablet; Commonly known as: DESYREL; Dose: 300 mg;   Refills: 0       Return Appointments and Scheduled Appointments       Consults, Procedures, Diagnostics, Micro, Pathology   Consults: Oncology and Interventional Pain  Surgical Procedures & Dates:  L5-S1 TFESI on 4/1  Significant Diagnostic Studies, Micro and Procedures: noted in brief hospital course  Significant Pathology: noted in brief hospital course                       Discharge Disposition, Condition   Patient Disposition: Home or Self Care [01]  Condition at Discharge: Stable    Code Status   Full Code    Patient Instructions     Activity       Activity as Tolerated   As directed      It is important to keep increasing your activity level after you leave the hospital.  Moving around can help prevent blood clots, lung infection (pneumonia) and other problems.  Gradually increasing the number of times you are up moving around will help you return to your normal activity level more quickly.  Continue to increase the number of times you are up to the chair and walking daily to return to your normal activity level. Begin to work toward your normal activity level at discharge          Diet  Regular Diet   As directed      You have no dietary restriction. Please continue with a healthy balanced diet.             Discharge education provided to patient.    Additional Orders: Case Management, Supplies, Home Health     Home Health/DME       None              Signed:  Thurston Flow, MD  03/29/2024      cc:  Primary Care Physician:  Susan Ensign   Verified    Referring physicians:  Self, Referral   Additional provider(s):        Did we miss something? If additional records are needed, please fax a request on office letterhead to [phone]. Please include the patient's name, date of birth, fax number and type of information needed. Additional request can be made by email at [email or web address]. For general questions of information about electronic records sharing, call [phone].

## 2024-03-29 NOTE — Case Management (ED)
 CMA Note:       Was asked to get a quote for cab to home address worse case scenario the trip would cost $204.60.  Then asked to deliver a pass to the nurses station per request of Suzzette Eth, [facility].    Tobi Fortes  Case Management Assistant  For additional assistance please contact RNCM  *

## 2024-03-29 NOTE — Progress Notes
 Cheyenne Ayers discharged on 03/29/2024.   Cheyenne Ayers  Discharge instructions reviewed with patient.  Valuables returned:   ADL Belongings at Bedside: Eyeglasses/contacts (readers).  Home medications: Pt's home medications were returned and patient discharged with her glasses, phone, purse and walker   .  Functional assessment at discharge complete: Yes .  Pt was wheeled off the unit via wheelchair. PT was given a cab pass for transportation home.

## 2024-03-30 NOTE — Progress Notes
 Patient called unit regarding missing home medications.  Patient was informed her medications were returned to her at discharge.  UC confirmed this with nurse from day of discharge.

## 2024-08-14 ENCOUNTER — Encounter: Admit: 2024-08-14 | Discharge: 2024-08-14 | Payer: MEDICARE

## 2024-10-30 ENCOUNTER — Encounter: Admit: 2024-10-30 | Discharge: 2024-10-30 | Payer: MEDICARE

## 2024-10-30 ENCOUNTER — Emergency Department: Admit: 2024-10-30 | Discharge: 2024-10-30 | Payer: MEDICARE

## 2024-10-30 VITALS — BP 129/75 | HR 76 | Temp 98.60000°F | Ht 62.008 in | Wt 124.0 lb

## 2024-10-30 DIAGNOSIS — G44311 Acute post-traumatic headache, intractable: Secondary | ICD-10-CM

## 2024-10-30 DIAGNOSIS — N179 Acute kidney failure, unspecified: Secondary | ICD-10-CM

## 2024-10-30 DIAGNOSIS — R4020 Unspecified coma: Secondary | ICD-10-CM

## 2024-10-30 DIAGNOSIS — I050 Rheumatic mitral stenosis: Secondary | ICD-10-CM

## 2024-10-30 DIAGNOSIS — N189 Chronic kidney disease, unspecified: Secondary | ICD-10-CM

## 2024-10-30 DIAGNOSIS — W19XXXA Unspecified fall, initial encounter: Secondary | ICD-10-CM

## 2024-10-30 DIAGNOSIS — R296 Repeated falls: Principal | ICD-10-CM

## 2024-10-30 DIAGNOSIS — D649 Anemia, unspecified: Secondary | ICD-10-CM

## 2024-10-30 DIAGNOSIS — M1990 Unspecified osteoarthritis, unspecified site: Secondary | ICD-10-CM

## 2024-10-30 DIAGNOSIS — I350 Nonrheumatic aortic (valve) stenosis: Secondary | ICD-10-CM

## 2024-10-30 DIAGNOSIS — I10 Essential (primary) hypertension: Secondary | ICD-10-CM

## 2024-10-30 MED ORDER — FENTANYL CITRATE (PF) 50 MCG/ML IJ SOLN
50 ug | Freq: Once | INTRAVENOUS | 0 refills | Status: CP
Start: 2024-10-30 — End: ?
  Administered 2024-10-31: 04:00:00 50 ug via INTRAVENOUS

## 2024-10-30 MED ORDER — LACTATED RINGERS IV BOLUS
500 mL | Freq: Once | INTRAVENOUS | 0 refills | Status: CP
Start: 2024-10-30 — End: ?
  Administered 2024-10-31: 03:00:00 500 mL via INTRAVENOUS

## 2024-10-30 NOTE — ED Notes [6]
 Pt brought into the ED by family for fall. Patient states she fell with no warning till she woke up on floor covered in blood was on Friday. Been using a walker to get around. Still feels shaky  Pain is at a 5/10 on pain management takes hydro at home. Having back pain that is chronic and has gotten worse.  Patient is A&Ox4, Breathing is even and unlabored.  Patient denies any new meds, supplements, ETOH, or Drugs. Pt denies N/V, diarrhea, chest pain, SOA, fevers / chills. Bed in lowest locked position, call light with reach. Patients belongings secured in bag with pt sticker.

## 2024-10-30 NOTE — ED Notes [6]
 ED Initial Provider Note:    This patient was seen in the ED triage area to initiate and expedite the patients ED care when possible.    ED Chief Complaint:   Chief Complaint   Patient presents with    Fall     Pt fell on Friday at home. Pt states was seen after fall. No blood thinner and states LOC after fall. Pt states wants to see neurologist due to multiple falls        S: Cheyenne Ayers is a 78 y.o. female who presents to the Emergency Department for fall. The patient reports that she was walking down a hallway when she states that she fell to the ground, stating that it felt like someone threw her to the ground. She did hit her face and had LOC. EMS took patient to an outside facilty and has normal scans. She was advised placement for PT and OT but patient declined.  She presents today because she would like to see a neurologist to figure out why she continually keeps falling because it is neurological.  She denies any weakness in her legs.  She does have a history of bulged disc and receives epidural injections.  No loss of bowel or bladder control.  No saddle anesthesias.      PMHx:  Past Medical History:    Bipolar disorder (CMS-HCC)    Carotid stenosis    Chest pressure    Chronic back pain    Chronic pain    GERD (gastroesophageal reflux disease)    HTN (hypertension)    Hyperlipidemia    Migraine    Non compliance w medication regimen    Post-polio syndrome (CMS-HCC)       There were no vitals taken for this visit.   O: Brief Physical: bruising to the nose and upper lip with dried blood.  Patient alert and oriented x 4.  No acute distress.    A/P: The patient was seen by me as an initial provider in triage. A brief history and physical was obtained. My exam is intended to be an initial medial screening exam. Initial orders have been placed by me. My working diagnosis is fracture, contusion, sprain, strain, concussion.    The patient is deemed appropriate for the main ED. The patient's care will be resumed by the ED provider care team once the patient is roomed in the ED. A more detailed / complete H&P will be documented by those providers.

## 2024-10-31 ENCOUNTER — Observation Stay: Admit: 2024-10-31 | Discharge: 2024-10-31 | Payer: MEDICARE

## 2024-10-31 ENCOUNTER — Emergency Department: Admit: 2024-10-31 | Discharge: 2024-10-30 | Payer: MEDICARE

## 2024-10-31 ENCOUNTER — Encounter: Admit: 2024-10-31 | Discharge: 2024-10-31 | Payer: MEDICARE

## 2024-10-31 ENCOUNTER — Inpatient Hospital Stay: Admit: 2024-10-31 | Discharge: 2024-10-31 | Payer: MEDICARE

## 2024-10-31 LAB — URINALYSIS DIPSTICK REFLEX TO CULTURE
~~LOC~~ BKR NITRITE: NEGATIVE
~~LOC~~ BKR URINE BILE: NEGATIVE
~~LOC~~ BKR URINE KETONE: NEGATIVE

## 2024-10-31 LAB — ECG 12-LEAD
P AXIS: 61 degrees (ref 3.5–5.0)
P-R INTERVAL: 164 ms — ABNORMAL HIGH (ref 0.35–5.00)
Q-T INTERVAL: 438 ms (ref 150–400)
QRS DURATION: 96 ms — ABNORMAL HIGH (ref 11.0–15.0)
QTC CALCULATION (BAZETT): 482 ms (ref 7.0–11.0)
R AXIS: 46 degrees (ref 24.0–44.0)
T AXIS: 67 degrees — ABNORMAL HIGH (ref 4.0–12.0)
VENTRICULAR RATE: 73 {beats}/min — ABNORMAL HIGH (ref 7–25)

## 2024-10-31 LAB — VITAMIN B12: ~~LOC~~ BKR VITAMIN B12: 529 pg/mL — ABNORMAL LOW (ref 180–914)

## 2024-10-31 LAB — CBC AND DIFF
~~LOC~~ BKR ABSOLUTE BASO COUNT: 0.1 10*3/uL (ref 0.00–0.20)
~~LOC~~ BKR ABSOLUTE EOS COUNT: 0.2 10*3/uL (ref 0.00–0.45)
~~LOC~~ BKR ABSOLUTE LYMPH COUNT: 2.8 10*3/uL — ABNORMAL LOW (ref 1.00–4.80)
~~LOC~~ BKR ABSOLUTE MONO COUNT: 1 10*3/uL — ABNORMAL HIGH (ref 0.00–0.80)
~~LOC~~ BKR ABSOLUTE NEUTROPHIL: 3.9 10*3/uL (ref 1.80–7.00)
~~LOC~~ BKR EOSINOPHILS %: 3 % (ref 0.0–5.0)
~~LOC~~ BKR MDW (MONOCYTE DISTRIBUTION WIDTH): 15 (ref <=20.6)
~~LOC~~ BKR RBC COUNT: 3.2 10*6/uL — ABNORMAL LOW (ref 4.00–5.00)
~~LOC~~ BKR WBC COUNT: 8.1 10*3/uL (ref 4.50–11.00)

## 2024-10-31 LAB — HIGH SENSITIVITY TROPONIN I 4 HR
~~LOC~~ BKR HI SEN TNI DELTA 4-2: 0.3 % (ref 0–2)
~~LOC~~ BKR HIGH SENSITIVITY TROPONIN I 4 HOUR: 7.9 ng/L (ref 0–<15.0)

## 2024-10-31 LAB — COMPREHENSIVE METABOLIC PANEL: ~~LOC~~ BKR CO2: 20 mmol/L — ABNORMAL LOW (ref 21–30)

## 2024-10-31 LAB — HIGH SENSITIVITY TROPONIN I 2 HOUR
~~LOC~~ BKR HIGH SENSITIVITY TROPONIN I 2 HOUR: 7.6 ng/L (ref <15.0)
~~LOC~~ BKR HIGH SENSITIVITY TROPONIN I DELTA VALUE: -1

## 2024-10-31 LAB — HIGH SENSITIVITY TROPONIN I 0 HOUR: ~~LOC~~ BKR HIGH SENSITIVITY TROPONIN I 0 HOUR: 8.7 ng/L (ref <15.0)

## 2024-10-31 LAB — URINALYSIS MICROSCOPIC REFLEX TO CULTURE
~~LOC~~ BKR RBC, UA: 0 /HPF (ref 5.0–8.0)
~~LOC~~ BKR SQUAMOUS EPI CELLS: 0 /HPF
~~LOC~~ BKR WBC, UA: 2 /HPF — AB (ref 1.005–1.030)

## 2024-10-31 LAB — PHOSPHORUS: ~~LOC~~ BKR PHOSPHORUS: 4.7 mg/dL — ABNORMAL HIGH (ref 2.0–4.5)

## 2024-10-31 LAB — MAGNESIUM: ~~LOC~~ BKR MAGNESIUM: 2.7 mg/dL — ABNORMAL HIGH (ref 1.6–2.6)

## 2024-10-31 MED ORDER — ROSUVASTATIN 20 MG PO TAB
10 mg | Freq: Every evening | ORAL | 0 refills | Status: DC
Start: 2024-10-31 — End: 2024-11-03
  Administered 2024-10-31 – 2024-11-03 (×4): 10 mg via ORAL

## 2024-10-31 MED ORDER — MORPHINE 4 MG/ML IV SYRG
2 mg | Freq: Once | INTRAVENOUS | 0 refills | Status: CP
Start: 2024-10-31 — End: ?
  Administered 2024-10-31: 08:00:00 2 mg via INTRAVENOUS

## 2024-10-31 MED ORDER — TRAZODONE 100 MG PO TAB
200 mg | Freq: Every evening | ORAL | 0 refills | Status: DC
Start: 2024-10-31 — End: 2024-11-03
  Administered 2024-11-01 – 2024-11-03 (×3): 200 mg via ORAL

## 2024-10-31 MED ORDER — LORATADINE 10 MG PO TAB
10 mg | Freq: Every day | ORAL | 0 refills | Status: DC
Start: 2024-10-31 — End: 2024-10-31

## 2024-10-31 MED ORDER — ALPRAZOLAM 1 MG PO TAB
2 mg | Freq: Every evening | ORAL | 0 refills | Status: DC | PRN
Start: 2024-10-31 — End: 2024-11-03
  Administered 2024-10-31 – 2024-11-03 (×4): 2 mg via ORAL

## 2024-10-31 MED ORDER — LISINOPRIL 5 MG PO TAB
5 mg | Freq: Every day | ORAL | 0 refills | Status: DC
Start: 2024-10-31 — End: 2024-10-31

## 2024-10-31 MED ORDER — PANTOPRAZOLE 20 MG PO TBEC
20 mg | Freq: Every day | ORAL | 0 refills | Status: DC
Start: 2024-10-31 — End: 2024-11-01
  Administered 2024-10-31: 08:00:00 20 mg via ORAL

## 2024-10-31 MED ORDER — HEPARIN, PORCINE (PF) 5,000 UNIT/0.5 ML IJ SYRG
5000 [IU] | SUBCUTANEOUS | 0 refills | Status: DC
Start: 2024-10-31 — End: 2024-11-03

## 2024-10-31 MED ORDER — CITALOPRAM 20 MG PO TAB
20 mg | Freq: Every day | ORAL | 0 refills | Status: DC
Start: 2024-10-31 — End: 2024-11-03
  Administered 2024-11-01 – 2024-11-03 (×3): 20 mg via ORAL

## 2024-10-31 MED ORDER — POLYETHYLENE GLYCOL 3350 17 GRAM PO PWPK
1 | Freq: Every day | ORAL | 0 refills | Status: DC | PRN
Start: 2024-10-31 — End: 2024-11-03

## 2024-10-31 MED ORDER — FAMOTIDINE 20 MG PO TAB
20 mg | Freq: Two times a day (BID) | ORAL | 0 refills | Status: DC
Start: 2024-10-31 — End: 2024-10-31

## 2024-10-31 MED ORDER — LAMOTRIGINE 200 MG PO TAB
200 mg | Freq: Every day | ORAL | 0 refills | Status: DC
Start: 2024-10-31 — End: 2024-10-31

## 2024-10-31 MED ORDER — TRAZODONE 100 MG PO TAB
300 mg | Freq: Every evening | ORAL | 0 refills | Status: DC
Start: 2024-10-31 — End: 2024-10-31

## 2024-10-31 MED ORDER — FAMOTIDINE 20 MG PO TAB
20 mg | Freq: Every day | ORAL | 0 refills | Status: DC
Start: 2024-10-31 — End: 2024-11-03
  Administered 2024-10-31 – 2024-11-03 (×4): 20 mg via ORAL

## 2024-10-31 MED ORDER — IMS MIXTURE TEMPLATE
3 mg | Freq: Every evening | ORAL | 0 refills | Status: DC
Start: 2024-10-31 — End: 2024-11-03
  Administered 2024-10-31 – 2024-11-03 (×8): 3 mg via ORAL

## 2024-10-31 MED ORDER — HYDROCODONE-ACETAMINOPHEN 5-325 MG PO TAB
1 | ORAL | 0 refills | Status: DC | PRN
Start: 2024-10-31 — End: 2024-11-03
  Administered 2024-10-31 – 2024-11-03 (×4): 1 via ORAL

## 2024-10-31 MED ORDER — ASPIRIN 81 MG PO CHEW
81 mg | Freq: Every day | ORAL | 0 refills | Status: DC
Start: 2024-10-31 — End: 2024-10-31

## 2024-10-31 MED ORDER — ONDANSETRON HCL (PF) 4 MG/2 ML IJ SOLN
4 mg | INTRAVENOUS | 0 refills | Status: DC | PRN
Start: 2024-10-31 — End: 2024-11-03

## 2024-10-31 MED ORDER — CYCLOBENZAPRINE 5 MG PO TAB
5 mg | ORAL | 0 refills | Status: DC | PRN
Start: 2024-10-31 — End: 2024-11-03
  Administered 2024-11-01: 05:00:00 5 mg via ORAL

## 2024-10-31 MED ORDER — CITALOPRAM 40 MG PO TAB
40 mg | Freq: Every day | ORAL | 0 refills | Status: DC
Start: 2024-10-31 — End: 2024-10-31
  Administered 2024-10-31: 15:00:00 40 mg via ORAL

## 2024-10-31 MED ORDER — LAMOTRIGINE 200 MG PO TAB
200 mg | Freq: Every day | ORAL | 0 refills | Status: DC
Start: 2024-10-31 — End: 2024-11-03
  Administered 2024-10-31 – 2024-11-03 (×4): 200 mg via ORAL

## 2024-10-31 MED ORDER — MELATONIN 5 MG PO TAB
5 mg | Freq: Every evening | ORAL | 0 refills | Status: DC | PRN
Start: 2024-10-31 — End: 2024-11-03
  Administered 2024-11-03: 04:00:00 5 mg via ORAL

## 2024-10-31 MED ORDER — ONDANSETRON 4 MG PO TBDI
4 mg | ORAL | 0 refills | Status: DC | PRN
Start: 2024-10-31 — End: 2024-11-03

## 2024-10-31 MED ORDER — FERROUS SULFATE 325 MG (65 MG IRON) PO TAB
325 mg | Freq: Three times a day (TID) | ORAL | 0 refills | Status: DC
Start: 2024-10-31 — End: 2024-11-01

## 2024-10-31 MED ORDER — ENOXAPARIN 40 MG/0.4 ML SC SYRG
40 mg | Freq: Every day | SUBCUTANEOUS | 0 refills | Status: DC
Start: 2024-10-31 — End: 2024-10-31

## 2024-10-31 MED ORDER — AMLODIPINE 10 MG PO TAB
10 mg | Freq: Every day | ORAL | 0 refills | Status: DC
Start: 2024-10-31 — End: 2024-10-31

## 2024-10-31 MED ORDER — SENNOSIDES-DOCUSATE SODIUM 8.6-50 MG PO TAB
1 | Freq: Every day | ORAL | 0 refills | Status: DC | PRN
Start: 2024-10-31 — End: 2024-11-03

## 2024-10-31 MED ORDER — CHOLECALCIFEROL (VITAMIN D3) 25 MCG (1,000 UNIT) PO TAB
500 [IU] | Freq: Every day | ORAL | 0 refills | Status: DC
Start: 2024-10-31 — End: 2024-10-31

## 2024-10-31 MED ORDER — MELATONIN 5 MG PO TAB
20 mg | Freq: Every evening | ORAL | 0 refills | Status: DC
Start: 2024-10-31 — End: 2024-10-31

## 2024-10-31 NOTE — Progress Notes [1]
 CLINICAL NUTRITION                                                        Clinical Nutrition Initial Assessment    Name: Cheyenne Ayers   MRN: [id number]     DOB: [birth year]      Age: 78 y.o.  Admission Date: 10/30/2024     LOS: 1 day     Date of Service: 10/31/2024        Recommendation:  Okay to continue liberalized regular diet at this time.  Encourage 3 meals/day with 1-2 protein sources per meal along with frequent snacks between meals.   Offer Nepro supplements if pt skipping meals or meal intakes less than 50%.     Comments:  Cheyenne Ayers is a 78 year old female with a significant medical history including stage IV/V chronic kidney disease, anemia of chronic disease, hypertension, hyperlipidemia, bipolar disorder managed with chronic benzodiazepines, chronic pain requiring long-term opioids and muscle relaxants, and severe osteoarthritis with multiple joint replacements. She presented to the hospital requesting neurological evaluation due to a three-week history of trembling and recurrent falls.     RD consulted for nutrition assessment. Pt reported at well at breakfast and lunch today. Initially stated she's had a poor appetite recently due to pain. However, when RD repeated that she mentioned she's had a poor appetite recently later in the conversation, pt denied that statement. Pt reported she lives by herself. [employer] on Wheels during the week and then gets meals from church on Saturday. Reported typically eats 2 meals/day. Reported cooking for herself is difficult due to unable to stand for long periods of time. Reported she previously lost a lot of weight when she was first diagnosed with CKD. Stated her lowest weight was 111 lbs. Reported weight did come back and now usual weight is 129 lbs. RD reviewed wt hx per Care Everywhere: 55.8 kg (09/20/24), 58.9 kg (04/20/24) - overall pt with 4.5% wt loss in 7 months which is not significant per timeframe. Pt reported it's possible she may need to start peritoneal dialysis in the future. Denied known food allergies. Pt does not meet criteria for malnutrition and is not at acute nutrition risk.    Nutrition Assessment of Patient:  Admit Weight: 56.2 kg (11/3)  Usual Weight: 58.5 kg    Pertinent Allergies/Intolerances: NKFA  Pertinent Labs: BUN 45, Cr 3.37, Mag 2.7, Phos 4.7 (11/3)  Pertinent Meds: famotidine , ferrous sulfate , pantoprazole     Oral Diet Order: Regular  Current Energy Intake: Adequate    Weight Used for Calculation: 56.2 kg  Estimated Calorie Needs: 1125-1410 kcal (20-25 kcal/kg admit wt)  Estimated Protein Needs: 56-71 gm (20% of kcal)    Malnutrition Assessment:   Does not meet criteria    Nutrition Focused Physical Exam:   ;  ;     ;  ;      Physical Assessment:  Pressure Injury: none noted  Comment: last BM PTA    Nutrition Diagnosis:  Altered nutrition-related laboratory values, specify: (renal)  Etiology: stage IV/V chronic kidney disease  Signs & Symptoms: BUN 45, Cr 3.37, Mag 2.7, Phos 4.7 (11/3)    Intervention / Plan:  Assessed pts nutritional status  Monitor adequacy/tolerance to PO intakes  Monitor wt trends, GI function, meds, labs and skin integrity  Con Gardener MS, RDN, LD, CNSC  Available on Voalte

## 2024-10-31 NOTE — Care Coordination-Inpatient [[id number]]
 Med Teaching TBD [phone] will take calls for this patient till 8 AM.     Afterwards, kindly direct questions and concerns to the First on Call for designated service/team listed in chart   (Voalte is the preferred way of communication).    Belynda Bean, MD  AOD

## 2024-10-31 NOTE — Consults [2]
 Neurology Consult Note  Name: Cheyenne Ayers   MRN: [id number]     DOB: 1946-12-20      Age: 78 y.o.  Admission Date: 10/30/2024     LOS: 1 day     Date of Service: 10/31/2024       Reason for Consult:  frequent falls, normal neurologic exam but patient insistent on seeing neurology   Consult type: Co-Management w/Signed Orders    Assessment: Cheyenne Ayers is a 78 y.o. 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 Neurology consulted for the same.    Assessment & Plan  Loss of consciousness (CMS-HCC)    Recurrent falls  Impression: Unclear cause for falls. Denies pre-syncope. Denies weakness. Not mechanical. No vertigo or imbalance. Some preceded by leg tremoring (orthostatic tremor vs myoclonus)      Exam: Mild UE hyperreflexia without jaw jerk. Mild myoclonic movements of the RUE when hand outstretched. Some head rest tremor. Nml tone. Mild L HF and + SAb weakness on the L. Nml vibration and proprioception.     Lab:  B12- 529  TSH 1.24  Na 136, K 4.2, BUN 45, Cr 3.37, CO2 20  Hg 9.6, Plt 209,  WBC 8  UA- 2-10 WBC    Imaging:   CT head:       No intracranial hemorrhage or calvarial fracture.       CT cervical spine:       No acute fracture.       Recommendation:  - Daily orthostatic vital signs  - Copper lvl   - PT eval  - MRI c-spine and brain w/o  - Reduce centrally acting agents as able- narcotics can lead to myoclonus (+ and negative) that could cause leg tremors and falls in the setting of CKD.   - Will follow           Present on Admission:   Loss of consciousness (CMS-HCC)    The patient was evaluated for an acute change in neurologic status/an illness that may pose threat to life or function/a chronic illness with exacerbation or progression/a possible side effect to treatment. I reviewed lab data, personally reviewed neuroimaging (CT).     Langley Bray, DO  Clinical Associate Professor  Department of Neurology     Neurology consult 2 service  Please send Voalte messages regarding patient issues to Neurology Consults [phone] First Call between the hours of 0800-1800. For any urgent questions or concerns outside of those hours, please contact pager [phone].    ______________________________________________________________________________________________  Subjective    Chief complaint  Falls.     History Of Present Illness  Cheyenne Ayers is a 78 y.o. 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 Neurology consulted for the same.     Pt reports she has been having minor falls for the last 3-4 weeks. She cannot tell me how many, says a few. She had a more significant fall leading to admission where she was walking down her hallway and did not trip but fell forward onto her face, no LOC. No preceding dizziness, lightheadedness, weakness, confusion, flushing, etc. No warning. Scraped her knees and bruised her face. She says her minor falls were preceded by some leg tremoring and her leg would buckle, thinks her R  but maybe both.  Says those falls seemed to be precipitated by bu,ping into something then leg would buckle. Did not bump into anything the night of admission.     No resting tremors. No new weakness, numbness. No med changes. Did not take any pain meds for the 24 hour period leading to her fall. + weight gain (intentional). No EtOH. No lightheadedness, no h/o vertigo. No new hearing changes. + chronic neck, back, and joint pain r/t OA.     Orthos done n the ED and negative.     Medical/Surgical/Social/Family History{  Past Medical History:    Bipolar disorder (CMS-HCC)    Carotid stenosis    Chest pressure    Chronic back pain    Chronic pain    GERD (gastroesophageal reflux disease)    HTN (hypertension)    Hyperlipidemia    Migraine    Non compliance w medication regimen    Post-polio syndrome (CMS-HCC)     Surgical History:   Procedure Laterality Date    HX APPENDECTOMY      HX HYSTERECTOMY      HX JOINT REPLACEMENT      HX ROTATOR CUFF REPAIR      RIGHT      Social History     Tobacco Use    Smoking status: Former     Current packs/day: 0.00     Types: Cigarettes     Quit date: 10/29/1984     Years since quitting: 40.0    Smokeless tobacco: Never   Substance and Sexual Activity    Alcohol use: Not Currently     Comment: rarely    Drug use: No           Family History   Problem Relation Name Age of Onset    Cancer Mother      Stroke Father       Allergies: Calcium channel blocking agent diltiazem analogues, Nitrous oxide, Sulfa dyne, Ambien [zolpidem], Halcion [triazolam], Lincocin [lincomycin], and Stadol [butorphanol tartrate]  Current Medications   Medication Directions   ALPRAZolam  (XANAX ) 2 mg tablet Take one tablet by mouth at bedtime as needed for Anxiety.   amLODIPine (NORVASC) 10 mg tablet Take one tablet by mouth daily.  Patient not taking: Reported on 10/31/2024   aspirin  81 mg chewable tablet Take 1 Tab by mouth daily.  Patient not taking: Reported on 10/31/2024   CHOLEcalciferoL (vitamin D3) (OPTIMAL D3) 50,000 units capsule Take one capsule by mouth every 7 days.  Patient not taking: Reported on 10/31/2024   citalopram  (CELEXA ) 40 mg tablet Take one tablet by mouth daily.   cyclobenzaprine  HCl (CYCLOBENZAPRINE  PO) Take  by mouth.   famotidine  (PEPCID ) 40 mg tablet Take one tablet by mouth daily.   ferrous sulfate  (FEOSOL) 325 mg (65 mg iron ) tablet Take one tablet by mouth three times daily with meals. Take on an empty stomach at least 1 hour before or 2 hours after food.  Patient not taking: Reported on 10/31/2024   fexofenadine(+) (ALLEGRA) 180 mg tablet Take one tablet by mouth daily.    Patient not taking: Reported on 10/31/2024   HYDROcodone /acetaminophen  (NORCO) 5/325 mg tablet Take one tablet by mouth every 6 hours as needed for Pain.   lamoTRIgine  (LAMICTAL ) 200 mg tablet Take one tablet by mouth daily.   lisinopril (ZESTRIL) 5 mg tablet Take one tablet by mouth daily.  Patient not taking: Reported on 10/31/2024   MELATONIN PO Take 20 mg by mouth at bedtime  daily.  Patient not taking: Reported on 10/31/2024   omeprazole DR(+) (PRILOSEC) 40 mg capsule Take one capsule by mouth daily before breakfast.  Patient not taking: Reported on 10/31/2024   rOPINIRole  (REQUIP ) 1 mg tablet Take three tablets by mouth at bedtime daily.  Patient taking differently: Take two tablets by mouth at bedtime daily.   rosuvastatin  (CRESTOR ) 20 mg tablet Take one-half tablet by mouth at bedtime daily.   traZODone  (DESYREL ) 100 mg tablet Take three tablets by mouth at bedtime daily.  Patient taking differently: Take two tablets by mouth at bedtime daily.       Objective                        Vital Signs: Last Filed                 Vital Signs: 24 Hour Range   BP: 115/56 (11/04 0610)  Temp: 36.8 ?C (98.2 ?F) (11/04 9389)  Pulse: 73 (11/04 0610)  Respirations: 16 PER MINUTE (11/04 0610)  SpO2: 98 % (11/04 0610)  O2 Device: None (Room air) (11/04 0610) BP: (115-182)/(53-78)   Temp:  [36.8 ?C (98.2 ?F)-37.1 ?C (98.8 ?F)]   Pulse:  [68-85]   Respirations:  [13 PER MINUTE-21 PER MINUTE]   SpO2:  [96 %-100 %]   O2 Device: None (Room air)   Intensity Pain Scale (Self Report): 5 (10/30/24 1741) Vitals:    10/30/24 1622   Weight: 56.2 kg (124 lb)         Intake/Output Summary:  (Last 24 hours)    Intake/Output Summary (Last 24 hours) at 10/31/2024 0824  Last data filed at 10/31/2024 0112  Gross per 24 hour   Intake 860 ml   Output --   Net 860 ml              Physical Exam  General physical exam:  HEENT: normocephalic, eyes open with no discharge, nares patent, oropharynx is clear with no lesions, palate intact  Chest: normal configuration  Ab: soft, non-distended   Skin: no rashes or lesions    Neuro exam:   Mental status: Awake, alert, and oriented to person, place, time and situation  Memory: Recent and remote memory intact  Attention: Good span, follows conversation  Fund of knowledge: Appropriate    Speech:    Normal Abnormal   Fluency x    Comprehension x    Articulation x    Repetition x    Naming x        Cranial Nerves:    Normal Abnormal   II Pupils equal (3mm), round, reactive.       III, IV, VI EOMI, no nystagmus    V Intact to LT in V1-3    VII Face symmetrical, eye closure symmetrical HOH bilat- wears R aide   VIII Hearing intact to finger rub    IX, X Uvula midline and palate elevation symmetrical    XI Shrug equal     XII Tongue midline      Muscle/motor:   Tone: wnl  Bulk: wnl  Pronator drift: absent (unable on the L due to shoulder joint issue)       Right Left  Right Left   Shoulder abductors:  5 3-4 Hip flexors:  5 4+   Elbow flexors:  5 5 Hip abductors:  5 5   Elbow extensors:  5 5 Hip extensors:      Wrist extensors:  Hip adductors:  5 5   Wrist flexors:    Knee flexors:  5 5   Finger flexors:  5 5 Knee extensors:  5 5   Finger extensors:    Ankle plantar flexors:  5 5   Finger abductors:    Ankle dorsiflexors:  5 5   Thumb abductors:    Ankle inversion:         Ankle eversion:         Toe extensors:         Toe flexors:        No abnormal movements, rigidity or spasticity    Reflexes:      Right Left   Triceps 3 3   Biceps 2+ 2+   Brachioradialis 2 2   Patella 2 1   Ankle 0 0   Plantar downgoing downgoing   Other reflexes:  Jaw: absent  Hoffman: absent    Sensation:    Normal RUE LUE RLE LLE   Light Touch x       Pin Prick        Temperature x       Vibration x       Proprioception nml           Coordination:    Normal Abnormal Right Abnormal Left   Finger to Nose x     Rapid alternating       Heel to Shin x     Finger tap x     Foot tap      Other        Gait and Station:  Deferred     Lab/Radiology/Other Diagnostic Tests:  Results for orders placed or performed during the hospital encounter of 10/30/24 (from the past 24 hours)   URINALYSIS DIPSTICK REFLEX TO CULTURE    Collection Time: 10/30/24  7:06 PM    Specimen: Midstream; Urine   Result Value Ref Range    Color,UA Straw     Turbidity,UA Clear Clear    Specific Gravity-Urine 1.008 1.005 - 1.030    pH,UA 6.0 5.0 - 8.0    Protein,UA 1+ (A) Negative    Glucose,UA Negative Negative    Ketones,UA Negative Negative    Bilirubin,UA Negative Negative    Blood,UA 1+ (A) Negative    Urobilinogen,UA Normal Normal    Nitrite,UA Negative Negative    Leukocytes,UA 1+ (A) Negative   URINALYSIS MICROSCOPIC REFLEX TO CULTURE    Collection Time: 10/30/24  7:06 PM    Specimen: Midstream; Urine   Result Value Ref Range    WBCs,UA 2 - 10 (A) None, 0 - 2  /HPF    RBCs,UA 0 - 2 None, 0 - 2  /HPF    Mucous,UA Trace None, Trace /LPF    Squamous Epithelial Cells 0 - 2 None, 0 - 2 , 2 - 5 /HPF   CBC AND DIFF    Collection Time: 10/30/24  7:10 PM   Result Value Ref Range    White Blood Cells 8.10 4.50 - 11.00 10*3/uL    Red Blood Cells 3.24 (L) 4.00 - 5.00 10*6/uL    Hemoglobin 9.6 (L) 12.0 - 15.0 g/dL    Hematocrit 71.6 (L) 36.0 - 45.0 %    MCV 87.3 80.0 - 100.0 fL    MCH 29.7 26.0 - 34.0 pg    MCHC 34.0 32.0 - 36.0 g/dL    RDW 84.5 (H) 88.9 - 15.0 %    Platelet Count 209 150 -  400 10*3/uL    MPV 7.4 7.0 - 11.0 fL    Neutrophils 49.0 41.0 - 77.0 %    Lymphocytes 34.7 24.0 - 44.0 %    Monocytes 12.1 (H) 4.0 - 12.0 %    Eosinophils 3.0 0.0 - 5.0 %    Basophils 1.2 0.0 - 2.0 %    Absolute Neutrophil Count 3.90 1.80 - 7.00 10*3/uL    Absolute Lymph Count 2.80 1.00 - 4.80 10*3/uL    Absolute Monocyte Count 1.00 (H) 0.00 - 0.80 10*3/uL    Absolute Eosinophil Count 0.20 0.00 - 0.45 10*3/uL    Absolute Basophil Count 0.10 0.00 - 0.20 10*3/uL    MDW (Monocyte Distribution Width) 15.9 <=20.6   COMPREHENSIVE METABOLIC PANEL    Collection Time: 10/30/24  7:10 PM   Result Value Ref Range    Sodium 136 (L) 137 - 147 mmol/L    Potassium 4.2 3.5 - 5.1 mmol/L    Chloride 107 98 - 110 mmol/L    Glucose 97 70 - 100 mg/dL    Blood Urea Nitrogen 45 (H) 7 - 25 mg/dL    Creatinine 6.62 (H) 0.40 - 1.00 mg/dL    Calcium 9.1 8.5 - 89.3 mg/dL    Total Protein 6.7 6.0 - 8.0 g/dL    Total Bilirubin 0.4 0.2 - 1.3 mg/dL    Albumin 4.5 3.5 - 5.0 g/dL    Alk Phosphatase 84 25 - 110 U/L    AST 23 7 - 40 U/L    ALT 11 7 - 56 U/L    CO2 20 (L) 21 - 30 mmol/L    Anion Gap 9 3 - 12    Glomerular Filtration Rate (GFR) 14 (L) >60 mL/min   VITAMIN B12    Collection Time: 10/30/24  7:10 PM   Result Value Ref Range    Vitamin B12 529 180 - 914 pg/mL   MAGNESIUM    Collection Time: 10/30/24  7:10 PM   Result Value Ref Range    Magnesium 2.7 (H) 1.6 - 2.6 mg/dL   PHOSPHORUS    Collection Time: 10/30/24  7:10 PM   Result Value Ref Range    Phosphorus 4.7 (H) 2.0 - 4.5 mg/dL   TSH WITH FREE T4 REFLEX    Collection Time: 10/30/24  7:10 PM   Result Value Ref Range    TSH 1.24 0.35 - 5.00 ?IU/mL   HIGH SENSITIVITY TROPONIN I 0 HOUR    Collection Time: 10/30/24  7:10 PM   Result Value Ref Range    hs Troponin I 0 Hour 8.7 <15.0 ng/L   FOLATE, SERUM    Collection Time: 10/30/24  7:10 PM   Result Value Ref Range    Serum Folate >22.3 >3.9 ng/mL     Radiology  (Last 24 hours)                 10/30/24 2006  CT HEAD WO CONTRAST Final result    Impression:          CT head:       No intracranial hemorrhage or calvarial fracture.       CT cervical spine:       No acute fracture.       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       Multilevel cervical spondylosis and stable operative changes of C4-C6 ACDF.            Finalized  by Cathaleen JONETTA Race, MD on 10/30/2024 8:20 PM. Dictated by Cathaleen JONETTA Race, MD on 10/30/2024 8:13 PM.       10/30/24 2006  CT SPINE CERVICAL WO CONTRAST Final result    Impression:          CT head:       No intracranial hemorrhage or calvarial fracture.       CT cervical spine:       No acute fracture.       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       Multilevel cervical spondylosis and stable operative changes of C4-C6 ACDF.            Finalized by Cathaleen JONETTA Race, MD on 10/30/2024 8:20 PM. Dictated by Cathaleen JONETTA Race, MD on 10/30/2024 8:13 PM.               I personally reviewed the following CT head

## 2024-10-31 NOTE — Progress Notes [1]
 Patien expressed frustration regarding her Xanax  and Norco doses being decreased. Patient stated I should have brought the note from my psychiatrist saying I can take up to 4 tablets of Alprazolam . I can't sleep without my Alprazolam . Patient advised she can bring up her concerns with her team in the morning. Patient stated Oh I will, I think they are barking up the wrong tree. My medications are not what is causing me to fall. Education provided regarding potential medication side effects. Patient not receptive to education. Patient agreed to take night meds. Call light within reach.

## 2024-10-31 NOTE — Patient Refusal [[id number]]
 Patient/family declined:  Other: Tele monitoring.    Patient/family educated on importance of intervention to their safety/quality of care. Patient/family continues to decline care.    Reason Why Patient/Family declined:  Patient states she was hooked up to a heart monitor all last night in the emergency department. Patient states that they should have plenty of data from last night. I don't need to be wearing that tonight. I see a cardiologist and my heart is not an issue right now. I am having an echo done. I do not need the heart monitor.    Individualized safety and/or care plan implemented. If additional safety measures implemented, please list them.     Escalated to:  Unit coordinator/charge nurse and Provider: Oneil Cheshire, DO.

## 2024-10-31 NOTE — Case Mgmt DC Plan [600024]
 Case Management Admission Assessment    NAME:Cheyenne Ayers                          MRN: [id number]             DOB:11-06-1946          AGE: 78 y.o.  ADMISSION DATE: 10/30/2024             DAYS ADMITTED: LOS: 1 day      Today?s Date: 10/31/2024    Source of Information: Patient and EMR    Per medical record, 78 year old female with a significant medical history including stage IV/V chronic kidney disease, anemia of chronic disease, hypertension, hyperlipidemia, bipolar disorder managed with chronic benzodiazepines, chronic pain requiring long-term opioids and muscle relaxants, and severe osteoarthritis with multiple joint replacements. She presented to the hospital requesting neurological evaluation due to a three-week history of trembling and recurrent falls.       Plan  Plan: Case Management Assessment, Assist PRN with SW/NCM Services  NCM reviewed EMR.  NCM attended and participated in MPR huddle.  NCM spoke with patient to complete initial assessment. NCM offered contact information and provided explanation of NCM role. NCM provided opportunity for questions and discussion. Patient/family encouraged to contact Case Management team with questions and concerns during hospitalization and until patient is able to transition back to the patient's primary care physician.  Demographics verified with patient.  Patient reports that she lives alone in a single story home in [place]. Upon discharge, anticipate patient to return home with her friend Orie providing transportation.  Patient reports a history of the following: DME/HH/OPPT as documented below.  Confirmed patient follows with Dr. Ethel Bruns for primary care - last seen within the last 6-12 months. Patient unable to recall exact time.  NCM will continue to follow for discharge planning/needs.    Patient Address/Phone  [address]  [place] [address]  [phone] (home)     Emergency Contact  Extended Emergency Contact Information  Primary Emergency Contact: Granholm,Sara   [place]   Home Phone: [phone]  Mobile Phone: [phone]  Relation: Daughter  Interpreter needed? No  Secondary Emergency Contact: Woodruff,Vicki  Mobile Phone: [phone]  Relation: Friend    Systems Developer  Does the Patient Need Case Management to Arrange Discharge Transport? (ex: facility, ambulance, wheelchair/stretcher, Medicaid, cab, other): No  Will the Patient Use Family Transport?: Yes  Transportation Name, Phone and Availability #1: Patient's friend Orie [phone] will provide transportation upon discharge.    Expected Discharge Date       Living Situation Prior to Admission  Living Arrangements  Type of Residence: Home, independent  Living Arrangements: Alone  Financial Risk Analyst / Tub: Tub/Shower Unit  How many levels in the residence?: 2  Can patient live on one level if needed?: Yes  Does residence have entry and/or inside stairs?: Yes (1 step entry.)  Assistance needed prior to admit or anticipated on discharge: No  Level of Function   Prior level of function: Independent  Cognitive Abilities   Cognitive Abilities: Alert and Oriented, Engages in problem solving and planning, Understands nature of health condition, Participates in decision making, Recognizes impact of health condition on lifestyle    Financial Resources  Coverage  Primary Insurance: Medicare  Secondary Insurance: Medicare Supplement  Additional Coverage: None  Medication Coverage    Medication Coverage: Medicare Part D  Source  of Income   Source Of Income: SSI  Financial Assistance Needed?  Denies financial assistance needs at this time.    Psychosocial Needs  Mental Health  Mental Health History: No  Substance Use History  Substance Use History Screen: No  Other  None.    Current/Previous Services  PCP  Kyung Goodell, [phone], [phone]  Pharmacy    [facility] - [place], [place] - [address]  [address]  [place] [address]  Phone: [phone] Fax: [phone]    Durable Medical Equipment   Durable Medical Equipment at home: [facility]  Receiving home health: In the past  Agency name: Unable to recall  Hemodialysis or Peritoneal Dialysis  Undergoing hemodialysis or peritoneal dialysis: No  Tube/Enteral Feeds  Receive tube/enteral feeds: No  Infusion  Receive infusions: No  Private Duty  Private duty help used: No  Home and Community Based Services  Home and community based services: No  Ryan White  Ryan White: N/A  Hospice  Hospice: No  Outpatient Therapy  PT: In the past  Name of rehab location/group: [facility]  OT: No  SLP: No  Skilled Nursing Facility/Nursing Home  SNF: No  NH: No  Inpatient Rehab  IPR: In the past  When did patient receive care?: 2019  Name of Facility: [facility]  [facility] Acute [facility] Hospital  LTACH: No  Acute Hospital Stay  Acute Hospital Stay: In the past  Was patient's stay within the last 30 days?: No    Larraine Blumenthal, BSN, RN  Med Private R Nurse Case Manager  [phone] and available on Voalte

## 2024-10-31 NOTE — Patient Refusal [[id number]]
 Patient/family declined:  Bed/chair alarm, W/in arms reach during toileting/showering, and Other: Telemetry.    Patient/family educated on importance of intervention to their safety/quality of care. Patient/family continues to decline care.    Reason Why Patient/Family declined:  Patient does not want the bed alarm to go off when she sits at edge of bed. She also does not feel like she needs any assistive devices. Regarding telemetry, she states that theres nothing wrong with my heart. They got all the information they needed in the ER    Individualized safety and/or care plan implemented. If additional safety measures implemented, please list them.     Escalated to:  Unit coordinator/charge nurse.

## 2024-10-31 NOTE — Care Plan [600008]
 Problem: Discharge Planning  Goal: Participation in plan of care  Outcome: Goal Ongoing  Flowsheets (Taken 10/31/2024 1155)  Participation in Plan of Care: Involve patient/caregiver in care planning decision making  Goal: Knowledge regarding plan of care  Outcome: Goal Ongoing  Flowsheets (Taken 10/31/2024 1155)  Knowledge regarding plan of care:   Provide admission education to parent/caregiver   Provide fall prevention education   Provide VTE signs and symptoms education   Provide plan of care education   Provide infection prevention education   Provide pre-operative teaching   Provide procedural and treatment education   Provide medication management education  Goal: Prepared for discharge  Outcome: Goal Ongoing  Flowsheets (Taken 10/31/2024 1155)  Prepared for discharge:   Complete ADL ability assessment   Provide safe use medical equipment education   Provide discharge activity restrictions education   Provide diet and oral health education   Provide discharge materials appropriate to patient condition   Collaborate with multidisciplinary team for hospital discharge coordination     Problem: High Fall Risk  Goal: High Fall Risk  Outcome: Goal Ongoing  Flowsheets (Taken 10/31/2024 1155)  High Fall Risk:   All patients will receive: High fall risk sign, yellow wristband, yellow socks, gait belt, and shower shoes   Maximize bed functionality (optimize bed height, firm/flat surface)   Assess need for: quick release belt, PSM (video monitoring), assist x2 using clinical staff/equipment   Use safe patient handling equipment as appropriate   Educate patient to use call-light if tethered   Engage bed alarm - middle setting   Engage chair alarm     BH51 JHFRAT NOTE    Admission Date: 10/30/2024  Length of Stay: LOS: 1 day      Fall Risk/JHFRAT Interventions and Education (charting when applicable)   a. Elimination Interventions: N/A   b. Medications: Stay within arm's reach during toileting/showering (i.e., dizziness, orthostasis) , Educate patient on medication side effects, and Bed/chair alarm (i.e., change in mental status)    c. Patient Care Equipment: Needs assistance with patient care equipment when ambulating, Ensure environment is free of clutter and walkways are clear from tripping hazards, and Assess need for patient equipment and remove if not in use   d. Mobility: Assist x1   e. Cognition: Bed/Chair Alarm  and Stay within arm's reach while patient ambulating/toileting/showering   f. Risk for Moderate/Major Injury: Age: >65 yrs and Risk for fracture

## 2024-10-31 NOTE — Care Plan [600008]
 BH51 JHFRAT NOTE    Admission Date: 10/30/2024  Length of Stay: LOS: 1 day    Fall Risk/JHFRAT Interventions and Education (charting when applicable)   a. Elimination Interventions: N/A   b. Medications: Educate patient on medication side effects   c. Patient Care Equipment: Ensure environment is free of clutter and walkways are clear from tripping hazards and Assess need for patient equipment and remove if not in use   d. Mobility: Assist x1   e. Cognition: N/A   f. Risk for Moderate/Major Injury: Age: >65 yrs and Risk for fracture     Problem: Discharge Planning  Goal: Participation in plan of care  Outcome: Goal Ongoing  Goal: Knowledge regarding plan of care  Outcome: Goal Ongoing  Goal: Prepared for discharge  Outcome: Goal Ongoing     Problem: High Fall Risk  Goal: High Fall Risk  Outcome: Goal Ongoing

## 2024-10-31 NOTE — Progress Notes [1]
 General Progress Note    Name: Cheyenne Ayers        MRN: [id number]          DOB: 19-Sep-1946            Age: 78 y.o.  Admission Date: 10/30/2024       LOS: 1 day    Date of Service: 10/31/2024    Assessment/Plan:    #Falls  #Trembling  #Hx of possible TIA  -Reports trembling and minor falls x3 weeks, with a major fall happening this past Friday (10/31) where she was walking down the hall and fell forward onto her face w/o LOC; several small bruises are visible around the center of her face and on her nose  -Saw a neurologist in [place] for similar symptoms; all testing, including head imaging was negative, chalked up to possible TIA, prescribed aspirin  for secondary prevention - patient refuses to take it  -Polypharmacy may be contributing  -B12 and TSH normal  -EKG NSR w/ qTC prolongation  -CT head and C-spine normal  PLAN  >MRI head and C spine pending   > repeat orthostatics  > monitor on tele   > echo   >Neurology consult  >PT/OT consult         #Chronic LBP:  #Sciatica:  #Severe OA s/p bilateral knee, bilateral hip, right shoulder replacements:  PLAN  >Multimodal pain regimen limited by kidney disease               -Tylenol  ATC, flexeril  PRN, amitriptyline /gabapentin /baclofen  cream               -PRN norco               -One-time dose of 2mg  morphine given               -Heat compress  >PT/OT  >No red flag symptoms on admission, can consider further imaging if pain fails to improve     #Asymptomatic pyuria/bacteriuria:  -Reports having been to an ED within the last few weeks and having a UTI, being on abx she cannot recall, and having not completed the course  -UA here negative for nitrites, + for leukocytes, no bacteria  -Denies dysuria, increased urinary frequency, or hematuria  PLAN:  >Continue monitoring for sx  >F/u reflex'd culture     #Bipolar II:  #Chronic Benzo Use:  #Restless leg syndrome  #Insomnia  PLAN:  >Continue PTA lamictal , citalopram , trazodone , requip , melatonin  >Continue xanax  qhs PRN  > will discuss further polypharmacy and need for adjustment given her falls.   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 Cr at osh has ranged from 3-4   -Patient reports procedure done already for peritoneal dialysis but has yet to start  PLAN:  > trend Cr  >Avoid nephrotoxic agents  >Bolus of fluid given in ED; reassess with morning labs     #HTN  #HLD  #Aortic stenosis  #Mitral stenosis  PLAN:  >Held PTA lisinopril, amlodipine b/c patient reports she does not take them  >Continue PTA Rosuvastatin   >Echo pending      #Anemia  -Hgb 9.6 on admission  PLAN:  >Patient says she does not take her iron  supplement  >Trend CBC     #Hiatal hernia  #Hx of PUD  -Reports hx of H. Pylori and small non-operative hiatal hernia  PLAN:  >Held PTA Pantoprazole  b/c patient  says she does not take it  >Famotidine      #Chronic B12 deficiency  -Reports monthly injections  PLAN:  >Possible injection inpt if due     Diet: Renal  DVT PPx: hep subq  Code Status: FULL     Disposition: continue observation     Total Time Today was 55 minutes in the following activities: Preparing to see the patient, Obtaining and/or reviewing separately obtained history, Performing a medically appropriate examination and/or evaluation, Counseling and educating the patient/family/caregiver, Ordering medications, tests, or procedures, Referring and communication with other health care professionals (when not separately reported), Documenting clinical information in the electronic or other health record, Independently interpreting results (not separately reported) and communicating results to the patient/family/caregiver, and Care coordination (not separately reported)    _______________________________________________________________________    Subjective   Cheyenne Ayers is a 78 y.o. female. No acute events overnight. She is feeling ok this morning. Reports back pain which is chornic. Denies feeling dizzy or lightheaded. Denies chest pain or sob.     9 pt review of systems done and negative except for what is noted above.          Medications  Scheduled Meds:citalopram  (CeleXA ) tablet 40 mg, 40 mg, Oral, QDAY  enoxaparin (LOVENOX) syringe 40 mg, 40 mg, Subcutaneous, QDAY(21)  famotidine  (PEPCID ) tablet 20 mg, 20 mg, Oral, QDAY  ferrous sulfate  (FEOSOL) tablet 325 mg, 325 mg, Oral, TID w/ meals  lamoTRIgine  (LaMICtal ) tablet 200 mg, 200 mg, Oral, QDAY  loratadine  (CLARITIN ) tablet 10 mg, 10 mg, Oral, QDAY  pantoprazole  DR (PROTONIX ) tablet 20 mg, 20 mg, Oral, QDAY(21)  rOPINIRole  (REQUIP ) tablet 3 mg, 3 mg, Oral, QHS  rosuvastatin  (CRESTOR ) tablet 10 mg, 10 mg, Oral, QHS  traZODone  (DESYREL ) tablet 200 mg, 200 mg, Oral, QHS    Continuous Infusions:  PRN and Respiratory Meds:ALPRAZolam  QHS PRN, cyclobenzaprine  Q6H PRN, HYDROcodone /acetaminophen  Q6H PRN, melatonin QHS PRN, ondansetron  Q6H PRN **OR** ondansetron  Q6H PRN, polyethylene glycol 3350  QDAY PRN, sennosides-docusate sodium  QDAY PRN        Objective                        Vital Signs: Last Filed                 Vital Signs: 24 Hour Range   BP: 115/56 (11/04 0610)  Temp: 36.8 ?C (98.2 ?F) (11/04 9389)  Pulse: 73 (11/04 0610)  Respirations: 16 PER MINUTE (11/04 0610)  SpO2: 98 % (11/04 0610)  O2 Device: None (Room air) (11/04 0610) BP: (115-182)/(53-78)   Temp:  [36.8 ?C (98.2 ?F)-37.1 ?C (98.8 ?F)]   Pulse:  [68-85]   Respirations:  [13 PER MINUTE-21 PER MINUTE]   SpO2:  [96 %-100 %]   O2 Device: None (Room air)   Intensity Pain Scale (Self Report): 5 (10/30/24 1741) Vitals:    10/30/24 1622   Weight: 56.2 kg (124 lb)         Intake/Output Summary:  (Last 24 hours)    Intake/Output Summary (Last 24 hours) at 10/31/2024 0830  Last data filed at 10/31/2024 0112  Gross per 24 hour   Intake 860 ml   Output --   Net 860 ml              Physical Exam  General:  Alert, cooperative, no distress, appears stated age  Head:  Normocephalic, without obvious abnormality, atraumatic  Eyes:  Conjunctivae/corneas clear.  PERRL, EOMs intact.  Lungs:  Clear to auscultation bilaterally  Chest wall:  No tenderness or deformity.  Heart:  systolic murmer   Abdomen:  Soft, non-tender.  Bowel sounds normal.  No masses.  No organomegaly.  Extremities: Extremities normal, atraumatic, no cyanosis or edema  Peripheral pulses   2+ and symmetric, all extremities       Lab Review  Pertinent labs reviewed       Point of Care Testing  (Last 24 hours)  Glucose: 97 (10/30/24 1910)    Radiology and other Diagnostics Review:    Pertinent radiology reviewed.     Ausha Sieh T Kagan Hietpas, MD   Pager [phone]

## 2024-11-01 ENCOUNTER — Observation Stay: Admit: 2024-11-01 | Discharge: 2024-11-01 | Payer: MEDICARE

## 2024-11-01 LAB — 2D + DOPPLER ECHO
AORTIC VALVE STROKE VOLUME INDEX: 50
AV INDEX (NATIVE): 0.5
AV MEAN GRADIENT: 16 mmHg
AV PEAK GRADIENT: 29 mmHg
AV PEAK VELOCITY: 2.7 m/s
AV VALVE AREA: 1.3 cm2
BSA: 1.5 m2
DOP CALC AO VTI: 62 cm
DOP CALC LVOT AREA: 2.5 cm2
DOP CALC LVOT DIAMETER: 1.8 cm
DOP CALC LVOT PEAK VEL VTI: 32 cm
DOP CALC LVOT PEAK VEL: 1.4 m/s
DOP CALC LVOT STROKE VOLUME: 83 cm3
DOP CALC MV VTI: 43 cm
E/A RATIO: 0.8
ECHO EF: 65 %
EJECTION FRACTION: 47 %
FRACTIONAL SHORTENING: 27 % (ref 28–44)
INTERVENTRICULAR SEPTUM: 0.9 cm (ref 0.6–0.9)
IVC PROX: 1.1 cm
LATERAL E/E' RATIO: 11
LEFT ATRIUM INDEX: 36 mL/m2 (ref 16–34)
LEFT ATRIUM SIZE: 4.1 cm (ref 2.7–3.8)
LEFT ATRIUM VOLUME: 58 mL (ref 22–52)
LEFT INTERNAL DIMENSION IN SYSTOLE: 2.6 cm (ref 2.2–3.5)
LEFT VENTRICLE DIASTOLIC VOLUME INDEX: 52 mL/m2 (ref 29–61)
LEFT VENTRICLE DIASTOLIC VOLUME: 81 mL (ref 46–106)
LEFT VENTRICLE MASS INDEX: 59 g/m2 (ref 43–95)
LEFT VENTRICLE SYSTOLIC VOLUME INDEX: 15 mL/m2 (ref 8–24)
LEFT VENTRICLE SYSTOLIC VOLUME: 24 mL (ref 14–42)
LEFT VENTRICULAR INTERNAL DIMENSION IN DIASTOLE: 3.6 cm (ref 3.8–5.2)
LEFT VENTRICULAR MASS: 93 g (ref 67–162)
MEDIAL E/E' RATIO: 15
MV MEAN GRADIENT: 4 mmHg
MV PEAK A VEL: 1.2 m/s
MV PEAK E VEL PW: 1 m/s
MV PEAK GRADIENT: 8 mmHg
MV STENOSIS PRESSURE HALF TIME: 103 ms
MV VALVE AREA BY CONTINUITY EQUATION: 1.9 cm2
MV VALVE AREA P 1/2 METHOD: 2.1 cm2
POSTERIOR WALL: 0.9 cm (ref 0.6–0.9)
PROX AORTA: 3.1 cm (ref 1.9–3.5)
RA PRESSURE: 3
RELATIVE WALL THICKNESS: 0.5 (ref <=0.42)
RIGHT ATRIAL AREA: 12 cm2 (ref <18)
RIGHT HEART SYSTOLIC MMODE TAPSE: 1.8 cm (ref >1.7)
RIGHT HEART SYSTOLIC TDI S': 0.1 m/s
RIGHT VENTRICULAR BASAL DIAMETER: 3.3 cm (ref 2.5–4.1)
RIGHT VENTRICULAR MID DIAMETER: 1.9 cm (ref 1.9–3.5)
SIMPSONS BIPLANE EF: 71 %
SINUS: 2.7 cm (ref 2.4–3.6)
TDI LATERAL E': 0 m/s
TDI MEDIAL E': 0 m/s

## 2024-11-01 LAB — CBC AND DIFF
~~LOC~~ BKR BASOPHILS %: 1.1 % — ABNORMAL HIGH (ref 0.0–2.0)
~~LOC~~ BKR EOSINOPHILS %: 2.9 % — ABNORMAL LOW (ref 0.0–5.0)
~~LOC~~ BKR LYMPHOCYTES %: 43 % — ABNORMAL LOW (ref 24.0–44.0)
~~LOC~~ BKR MONOCYTES %: 11 % (ref 4.0–12.0)
~~LOC~~ BKR MPV: 7.2 fL (ref 7.0–11.0)

## 2024-11-01 LAB — CULTURE-URINE W/SENSITIVITY

## 2024-11-01 LAB — COMPREHENSIVE METABOLIC PANEL: ~~LOC~~ BKR ALBUMIN: 4.1 g/dL — ABNORMAL LOW (ref 3.5–5.0)

## 2024-11-01 LAB — COPPER: ~~LOC~~ BKR COPPER: 90 ug/dL (ref 80.0–155.0)

## 2024-11-01 MED ORDER — SODIUM CHLORIDE 0.9 % IJ SOLN
10 mL | Freq: Once | INTRAVENOUS | 0 refills | Status: CP
Start: 2024-11-01 — End: ?
  Administered 2024-11-01: 18:00:00 10 mL via INTRAVENOUS

## 2024-11-01 MED ORDER — PERFLUTREN LIPID MICROSPHERES 1.1 MG/ML IV SUSP
1-10 mL | Freq: Once | INTRAVENOUS | 0 refills | Status: CP | PRN
Start: 2024-11-01 — End: ?
  Administered 2024-11-01: 18:00:00 3 mL via INTRAVENOUS

## 2024-11-01 NOTE — Care Plan [600008]
 BH51 JHFRAT NOTE    Admission Date: 10/30/2024  Length of Stay: LOS: 1 day    Fall Risk/JHFRAT Interventions and Education (charting when applicable)   a. Elimination Interventions: N/A   b. Medications: Educate patient on medication side effects   c. Patient Care Equipment: Ensure environment is free of clutter and walkways are clear from tripping hazards and Assess need for patient equipment and remove if not in use   d. Mobility: Assist x1   e. Cognition: N/A   f. Risk for Moderate/Major Injury: Age: >65 yrs and Risk for fracture     Problem: Discharge Planning  Goal: Participation in plan of care  Outcome: Goal Ongoing  Goal: Knowledge regarding plan of care  Outcome: Goal Ongoing  Goal: Prepared for discharge  Outcome: Goal Ongoing     Problem: High Fall Risk  Goal: High Fall Risk  Outcome: Goal Ongoing

## 2024-11-01 NOTE — Progress Notes [1]
 Neurology Progress Note    Name: Cheyenne Ayers   MRN: [id number]     DOB: [birth year]      Age: 78 y.o.  Admission Date: 10/30/2024     LOS: 1 day     Date of Service: 11/01/2024    Assessment & Plan  Loss of consciousness (CMS-HCC)    Recurrent falls  Impression: Unclear cause for falls. Denies pre-syncope. Denies weakness. Not mechanical. No vertigo or imbalance. Some preceded by leg tremoring (orthostatic tremor vs myoclonus)   > I feel that her intermittent falls may be a combination of factors: slow clearance of narcotic metabolite (hydrocodone ) in the setting of CKD, possible orthostasis, plus contribution of central spinal stenosis (balance?). R/o dysrhythmia   - The lack of myoclonus or tremor on exam today with improved Cr level, makes her metabolic/pharmacologic possibility as cause for fall seem more likely    Lab:  B12- 529  TSH 1.24  Na 138, K 4, BUN 29, Cr 3.37-->2.49  Hg 10.3, Plt 209,  WBC 5.8  UA- 2-10 WBC    Imaging:   CT head:       No intracranial hemorrhage or calvarial fracture.       CT cervical spine:       No acute fracture.       MRI head: 1.  No evidence of acute intracranial pathology.   2.  Mild generalized cerebral volume loss.     MRI c-spine: 1.  Prior C4-C6 ACDF with solid interbody fusion.   2.  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 No obvious cord edema.   3.  Additional multilevel mild to moderate foraminal stenosis as above.         Recommendation:  - Daily orthostatic vital signs at least daily - re-ordered   - Copper lvl in process- if low, would replace  - PT eval appreciated   - Reduce centrally acting agents as able- narcotics can lead to myoclonus (+ and negative) that could cause leg tremors and falls in the setting of CKD. Pt very resistant to this   - DC home with MCOT- ? Asymptomatic pre-syncopal event  > Pt already follows with spine surgeon and cardiology as OPT, can f/u as scheduled  - Will sign off for now, please call with any questions           Present on Admission:   Loss of consciousness (CMS-HCC)     I discussed the case with the following providers - Dr. Zelalem and Total Time Today was 50 minutes in the following activities: Preparing to see the patient, Performing a medically appropriate examination and/or evaluation, Counseling and educating the patient/family/caregiver, Referring and communication with other health care professionals (when not separately reported), Documenting clinical information in the electronic or other health record, and Independently interpreting results (not separately reported) and communicating results to the patient/family/caregiver                           Langley Bray, DO  Clinical Associate Professor  Department of Neurology     Neurology consult 2 service  Please send Voalte messages regarding patient issues to Neurology Consults [phone] First Call between the hours of 0800-1800. For any urgent questions or concerns outside of those hours, please contact pager [phone].    _____________________________________________________________________  Subjective    Cheyenne Ayers is a 78 y.o. female.  Patient resting, rouses easily.  Says she feels well. No new sxs. Discussed MRI, lab results. Advised that my working bernadette is that she has severe central stenosis that could affect her balance (though not intermittently so) and when her Cr is up and her narcotics are more slowly clearing, she may have predilection for falls - leg myoclonus/tremors, giveaway, etc. Concern persists for orthostasis and arrhythmia due to intermittent nature. B12 and other metabolic factors are corrected, Cu pending. I get the sense she is not happy with the etiologic possibilities as outlined.     Objective   Vital Signs: Last Filed In 24 Hours Vital Signs: 24 Hour Range   BP: 136/46 (11/05 0824)  Temp: 36.7 ?C (98.1 ?F) (11/05 9175)  Pulse: 83 (11/05 0824)  Respirations: 16 PER MINUTE (11/05 0429)  SpO2: 100 % (11/05 0824)  O2 Device: None (Room air) (11/05 0824) BP: (129-174)/(46-95)   Temp:  [36.6 ?C (97.9 ?F)-37.3 ?C (99.2 ?F)]   Pulse:  [74-83]   Respirations:  [16 PER MINUTE-18 PER MINUTE]   SpO2:  [96 %-100 %]   O2 Device: None (Room air)          Physical Exam                       Vital Signs: Last Filed                 Vital Signs: 24 Hour Range   BP: 136/46 (11/05 0824)  Temp: 36.7 ?C (98.1 ?F) (11/05 9175)  Pulse: 83 (11/05 0824)  Respirations: 16 PER MINUTE (11/05 0429)  SpO2: 100 % (11/05 0824)  O2 Device: None (Room air) (11/05 0824) BP: (129-174)/(46-95)   Temp:  [36.6 ?C (97.9 ?F)-37.3 ?C (99.2 ?F)]   Pulse:  [74-83]   Respirations:  [16 PER MINUTE-18 PER MINUTE]   SpO2:  [96 %-100 %]   O2 Device: None (Room air)     Vitals:    10/30/24 1622   Weight: 56.2 kg (124 lb)         Intake/Output Summary:  (Last 24 hours)    Intake/Output Summary (Last 24 hours) at 11/01/2024 9167  Last data filed at 10/31/2024 2115  Gross per 24 hour   Intake 580 ml   Output --   Net 580 ml                Neuro: alert, attentive, oriented  Cranial Nerves: pupils are round and reactive to light, extraocular movements are intact, facial muscle tone is normal, no droop, tongue is midline, normal speech, localizes sounds, good shoulder shrug  Muscle/motor: Tone is normal and symmetric throughout, strength in arms and legs 5/5  Sensation: intact throughout to soft touch  Coordination: finger to nose intact. No tremor noted.  Gait: deferred   Reflexes: Deep tendon reflexes are 2/4 throughout, 2+ triceps. Neg hoffman. Toes downgoing    Scheduled Meds:citalopram  (CeleXA ) tablet 20 mg, 20 mg, Oral, QDAY  famotidine  (PEPCID ) tablet 20 mg, 20 mg, Oral, QDAY  ferrous sulfate  (FEOSOL) tablet 325 mg, 325 mg, Oral, TID w/ meals  heparin  (porcine) PF syringe 5,000 Units, 5,000 Units, Subcutaneous, Q8H  lamoTRIgine  (LaMICtal ) tablet 200 mg, 200 mg, Oral, QDAY  pantoprazole  DR (PROTONIX ) tablet 20 mg, 20 mg, Oral, QDAY(21)  rOPINIRole  (REQUIP ) tablet 3 mg, 3 mg, Oral, QHS  rosuvastatin  (CRESTOR ) tablet 10 mg, 10 mg, Oral, QHS  traZODone  (DESYREL ) tablet 200 mg, 200 mg, Oral, QHS    Continuous Infusions:  PRN and  Respiratory Meds:ALPRAZolam  QHS PRN, cyclobenzaprine  Q6H PRN, HYDROcodone /acetaminophen  Q6H PRN, melatonin QHS PRN, ondansetron  Q6H PRN **OR** ondansetron  Q6H PRN, polyethylene glycol 3350  QDAY PRN, sennosides-docusate sodium  QDAY PRN       Lab/Radiology/Other Diagnostic Tests:  Results for orders placed or performed during the hospital encounter of 10/30/24 (from the past 24 hours)   HIGH SENSITIVITY TROPONIN I 2 HOUR    Collection Time: 10/31/24  9:44 AM   Result Value Ref Range    hs Troponin I 2 Hour 7.6 <15.0 ng/L    hs Troponin I 2-0hr Delta Value -1.1    HIGH SENSITIVITY TROPONIN I 4 HR    Collection Time: 10/31/24 11:21 AM   Result Value Ref Range    hs Troponin I 4 Hour 7.9 <15.0 ng/L    hs Troponin I 4-2hr Delta Value 0.3    CBC AND DIFF    Collection Time: 11/01/24  7:27 AM   Result Value Ref Range    White Blood Cells 5.80 4.50 - 11.00 10*3/uL    Red Blood Cells 3.52 (L) 4.00 - 5.00 10*6/uL    Hemoglobin 10.3 (L) 12.0 - 15.0 g/dL    Hematocrit 68.9 (L) 36.0 - 45.0 %    MCV 88.1 80.0 - 100.0 fL    MCH 29.3 26.0 - 34.0 pg    MCHC 33.3 32.0 - 36.0 g/dL    RDW 84.2 (H) 88.9 - 15.0 %    Platelet Count 209 150 - 400 10*3/uL    MPV 7.2 7.0 - 11.0 fL    Neutrophils 40.9 (L) 41.0 - 77.0 %    Lymphocytes 43.3 24.0 - 44.0 %    Monocytes 11.8 4.0 - 12.0 %    Eosinophils 2.9 0.0 - 5.0 %    Basophils 1.1 0.0 - 2.0 %    Absolute Neutrophil Count 2.40 1.80 - 7.00 10*3/uL    Absolute Lymph Count 2.50 1.00 - 4.80 10*3/uL    Absolute Monocyte Count 0.70 0.00 - 0.80 10*3/uL    Absolute Eosinophil Count 0.20 0.00 - 0.45 10*3/uL    Absolute Basophil Count 0.10 0.00 - 0.20 10*3/uL   COMPREHENSIVE METABOLIC PANEL    Collection Time: 11/01/24  7:27 AM   Result Value Ref Range    Sodium 138 137 - 147 mmol/L    Potassium 4.0 3.5 - 5.1 mmol/L    Chloride 107 98 - 110 mmol/L    Glucose 91 70 - 100 mg/dL    Blood Urea Nitrogen 29 (H) 7 - 25 mg/dL    Creatinine 7.50 (H) 0.40 - 1.00 mg/dL    Calcium 9.3 8.5 - 89.3 mg/dL    Total Protein 6.2 6.0 - 8.0 g/dL    Total Bilirubin 0.4 0.2 - 1.3 mg/dL    Albumin 4.1 3.5 - 5.0 g/dL    Alk Phosphatase 62 25 - 110 U/L    AST 17 7 - 40 U/L    ALT 10 7 - 56 U/L    CO2 24 21 - 30 mmol/L    Anion Gap 7 3 - 12    Glomerular Filtration Rate (GFR) 19 (L) >60 mL/min         I personally reviewed the following MRI head and c-spine

## 2024-11-01 NOTE — ED Notes [6]
 I attest to the documentation on 11/01/2024 by Rubie Marcus nursing student unless otherwise noted.

## 2024-11-01 NOTE — Care Plan [600008]
 Problem: Discharge Planning  Goal: Participation in plan of care  Outcome: Goal Ongoing  Goal: Knowledge regarding plan of care  Outcome: Goal Ongoing  Goal: Prepared for discharge  Outcome: Goal Ongoing     Problem: High Fall Risk  Goal: High Fall Risk  Outcome: Goal Ongoing     Problem: Pain  Goal: Management of pain  Outcome: Goal Ongoing  Goal: Knowledge of pain management  Outcome: Goal Ongoing  Goal: Progress Toward Pain Management Goals  Outcome: Goal Ongoing     Problem: VTE, Risk of  Goal: Absence of venous thrombosis  Outcome: Goal Ongoing  Goal: Knowledge of warfarin regimen  Outcome: Goal Ongoing     BH51 JHFRAT NOTE    Admission Date: 10/30/2024  Length of Stay: LOS: 2 days      Fall Risk/JHFRAT Interventions and Education (charting when applicable)   a. Elimination Interventions: N/A   b. Medications: Educate patient on medication side effects   c. Patient Care Equipment: Does not need assistance with patient care equipment when ambulating and Ensure environment is free of clutter and walkways are clear from tripping hazards   d. Mobility: Ensure the use of corrective lens and/or hearing aides are in place prior to ambulation    e. Cognition: N/A   f. Risk for Moderate/Major Injury: Age: >65 yrs and Risk for fracture

## 2024-11-01 NOTE — Progress Notes [1]
 PHYSICAL THERAPY  ASSESSMENT      Name: Cheyenne Ayers   MRN: [id number]     DOB: 1946-06-05      Age: 78 y.o.  Admission Date: 10/30/2024     LOS: 1 day     Date of Service: 11/01/2024      Mobility  Patient Turn/Position: Self  Mobility Level [facility] Highest Level of Mobility (JH-HLM): Walk 25 feet or more (to doorway/hallway)  Distance Walked (feet): 150 ft  Level of Assistance: Stand by assistance  Assistive Device: None  Activity Limited By: Fatigue;Patient request to stop    Subjective  Reason for Admission and Past Medical Hx: 78 year old female with a significant medical history including stage IV/V chronic kidney disease, anemia of chronic disease, hypertension, hyperlipidemia, bipolar disorder managed with chronic benzodiazepines, chronic pain requiring long-term opioids and muscle relaxants, and severe osteoarthritis with multiple joint replacements. She presented to the hospital requesting neurological evaluation due to a three-week history of trembling and recurrent falls  Mental / Cognitive: Alert;Cooperative;Follows commands  Pain: No complaint of pain  Pain Interventions: Patient agrees to participate in therapy with current pain level;Patient assisted into position of comfort    Home Living Situation  Lives With: Alone;Receives assistance from friends/neighbors  Type of Home: House  Entry Stairs: 1  In-Home Stairs: Able to live on main level (upstairs but pt does not have to access)  Bathroom Setup: Walk in shower  Patient Owned Equipment: Walker with wheels    Prior Level of Function  Level Of Independence: Independent with ADL and household mobility without device  Comments: Patient reports she cannot ambulate long distances due to pain. Will use electric cart at [employer]  History of Falls in Past 3 Months: Yes  Comments: Reports a fall which led to this admission - reports she fell forwards but had no warning signs prior (no dizziness, no knee buckling, etc). Reports she has had several other small falls leading up to this admission    ROM  R UE ROM: WFL   R UE ROM Method: Active  L UE ROM: WFL   L UE ROM Method: Active  R LE ROM: WFL  R LE ROM Method: Active  L LE ROM: WFL  L LE ROM Method: Active  ROM Comments: Per functional assessment    Strength  Overall Strength: WFL  Strength Comments: Per functional assessment    Sensation/Tone/Coordination  Head Control: Independent  Posture: Rounded shoulders    Bed Mobility/Transfer  Bed Mobility: Supine to Sit: Independent;Bed Flat  Comments: Patient sitting on EOB at end of session    Transfer Type: Sit to/from Stand  Transfer: Assistance Level: To/From;Bed;Standby Assist  Transfer: Assistive Device: None  Transfers: Type Of Assistance: For Safety Considerations    End Of Activity Status: In Bed;Nursing Notified;Instructed Patient to Request Assist with Mobility;Instructed Patient to Use Call Light    Comments: Patient declines to use gait belt as she states her strength/mobility is fine and is not the reason she fell    Balance  Sitting Balance: Static Sitting Balance;Dynamic Sitting Balance;2 UE Support;Standby Assist  Standing Balance: Static Standing Balance;Dynamic Standing Balance;No UE support;Standby Assist    Gait  Gait Distance: 150 feet  Gait: Assistance Level: Standby Assist  Gait: Assistive Device: None  Gait: Descriptors: Pace: Normal;Swing-Through Gait;No balance loss;Variable step length    Activity Limited By: Complaint of Fatigue;Patient Choice    Education  Persons Educated: Patient  Patient Barriers To Learning: None Noted  Teaching  Methods: Verbal Instruction  Patient Response: Verbalized Understanding  Topics: Plan/Goals of PT Interventions;Use of Assistive Device/Orthosis;Mobility Progression;Up with Assist Only;Importance of Increasing Activity;Ambulate With Nursing;Recommend Continued Therapy;Therapy Schedule    Assessment/Progress  Impaired Mobility Due To: Decreased Activity Tolerance;Medical Status Limitation  Assessment/Progress: Should Improve w/ Continued PT    AM-PAC 6 Clicks Basic Mobility Inpatient  Turning from your back to your side while in a flat bed without using bed rails: None  Moving from lying on your back to sitting on the side of a flat bed without using bedrails : None  Moving to and from a bed to a chair (including a wheelchair): None  Standing up from a chair using your arms (e.g. wheelchair, or bedside chair): None  To walk in hospital room: None  Climbing 3-5 steps with a railing: A Little  Basic Mobility Inpatient Raw Score: 23  Standardized (T-scale) Score: 50.88  Mobility Goal [facility]: JH-HLM 7 Walk >= 25 ft    Goals  Goal Formulation: With Patient  Time For Goal Achievement: 3 days, To, 5 days  Patient Will Go Supine To/From Sit: Independently  Patient Will Transfer Bed/Chair: Independently  Patient Will Transfer Sit to Stand: Independently  Patient Will Ambulate: Greater than 200 Feet, Independently    Plan  Treatment Interventions: Mobility training;Endurance training  Plan Frequency: 3-5 Days per Week  PT Plan for Next Visit: Progress gait, balance assessment, progress independence with mobility    PT Discharge Recommendations  Recommendation: Home with consistent supervision/assistance (Consider inpatient if consistent assistance is not available due to recurring falls)  Recommendation for Therapy Post Discharge: Outpatient  Patient Currently Requires Supervision For: Mobility  Patient Currently Requires Equipment: Owns what is needed      Therapist  Damien Jacobsen, PT  Date  11/01/2024

## 2024-11-01 NOTE — Patient Refusal [[id number]]
 Patient/family declined:  Bed/chair alarm, W/in arms reach during toileting/showering, and Other: telemetry monitoring, allergy armband.    Patient/family educated on importance of intervention to their safety/quality of care. Patient/family continues to decline care.    Reason Why Patient/Family declined:  Patient states she doesn't need an allergy armband because she won't be near anything she is allergic to while in the hospital. Patient refusing staff assistance with ambulating around unit despite being a high fall risk and recurrent falls. Patient refusing telemetry because she's not here for anything cardiac related. Education has been provided to patient on these topics by this RN.    Individualized safety and/or care plan implemented. If additional safety measures implemented, please list them.     Escalated to:  Unit coordinator/charge nurse.

## 2024-11-01 NOTE — Progress Notes [1]
 General Progress Note    Name: Cheyenne Ayers        MRN: [id number]          DOB: 06/19/1946            Age: 78 y.o.  Admission Date: 10/30/2024       LOS: 2 days    Date of Service: 11/01/2024    Assessment/Plan:    #Falls  #Trembling  #Hx of possible TIA  -Reports trembling and minor falls x3 weeks, with a major fall happening this past Friday (10/31) where she was walking down the hall and fell forward onto her face w/o LOC; several small bruises are visible around the center of her face and on her nose  -Saw a neurologist in [place] for similar symptoms; all testing, including head imaging was negative, chalked up to possible TIA, prescribed aspirin  for secondary prevention - patient refuses to take it  -Polypharmacy may be contributing  -B12 and TSH normal  -EKG NSR w/ qTC prolongation  -CT head and C-spine normal  -MRI head w/o acute finding   - MRI c spine Prior C4-C6 ACDF with solid interbody fusion. 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 No obvious cord edema. Additional multilevel mild to moderate foraminal stenosis as above.   - Echo with EF 65% mild MS and mild AS  - orthostatic vitals negative   PLAN  - repeat orthostatic vitals pending  - tele ordered. Pt not using at this time. She is willing to have long term monitoring at discharge  - follow on copper levels  - appreciate neurology assistance in evaluation. 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 Recommending consistent supervision at discharge  - discussed at length with patient.  Patient currently does not have anyone to provide supervision. She is also very clear  on not wanting to go to rehab or snf/long term care        #Chronic LBP:  #Sciatica:  #Severe OA s/p bilateral knee, bilateral hip, right shoulder replacements:  PLAN  >Multimodal pain regimen limited by kidney disease               -Tylenol  ATC, flexeril  PRN, amitriptyline /gabapentin /baclofen  cream               -PRN norco               -One-time dose of 2mg  morphine given               -Heat compress  >PT/OT  >No red flag symptoms on admission, can consider further imaging if pain fails to improve     #Asymptomatic pyuria/bacteriuria:  -Reports having been to an ED within the last few weeks and having a UTI, being on abx she cannot recall, and having not completed the course  -UA here negative for nitrites, + for leukocytes, no bacteria  -Denies dysuria, increased urinary frequency, or hematuria  PLAN:  >Continue monitoring for sx  >F/u reflex'd culture     #Bipolar II:  #Chronic Benzo Use:  #Restless leg syndrome  #Insomnia  PLAN:  >Continue PTA lamictal , citalopram , trazodone , requip , melatonin  >Continue xanax  qhs PRN  >patient does not want further adjustment of her medications as she has been stable on this regimen   >geriatrics consulted      # CKD 4-5  -Cr 3.37; GFR 14. Cr at osh has ranged from 3-4   -  Patient reports procedure done already for peritoneal dialysis but has yet to start  PLAN:  > trend Cr  >Avoid nephrotoxic agents  >Bolus of fluid given in ED; reassess with morning labs     #HTN  #HLD  #Aortic stenosis  #Mitral stenosis  PLAN:  >Held PTA lisinopril, amlodipine b/c patient reports she does not take them  >Continue PTA Rosuvastatin      #Anemia  -Hgb 9.6 on admission  PLAN:  >Patient says she does not take her iron  supplement  >Trend CBC     #Hiatal hernia  #Hx of PUD  -Reports hx of H. Pylori and small non-operative hiatal hernia  PLAN:  >Held PTA Pantoprazole  b/c patient says she does not take it  >Famotidine      #Chronic B12 deficiency  -Reports monthly injections  PLAN:  >Possible injection inpt if due     Diet: Renal  DVT PPx: hep subq  Code Status: FULL     Disposition: continue observation     Total Time Today was 60  minutes in the following activities: Preparing to see the patient, Obtaining and/or reviewing separately obtained history, Performing a medically appropriate examination and/or evaluation, Counseling and educating the patient/family/caregiver, Ordering medications, tests, or procedures, Referring and communication with other health care professionals (when not separately reported), Documenting clinical information in the electronic or other health record, Independently interpreting results (not separately reported) and communicating results to the patient/family/caregiver, and Care coordination (not separately reported)    _______________________________________________________________________    Subjective   Cheyenne Ayers is a 78 y.o. female. No acute events overnight. Patient states she is worried that she has parkinson's and that is why she came to [facility] for evaluation.  She continues to be very concerned about her fall.  She has not had any recurrent symtpoms since hospitalization.   Discussed at length disposition plan.      9 pt review of systems done and negative except for what is noted above.          Medications  Scheduled Meds:citalopram  (CeleXA ) tablet 20 mg, 20 mg, Oral, QDAY  famotidine  (PEPCID ) tablet 20 mg, 20 mg, Oral, QDAY  heparin  (porcine) PF syringe 5,000 Units, 5,000 Units, Subcutaneous, Q8H  lamoTRIgine  (LaMICtal ) tablet 200 mg, 200 mg, Oral, QDAY  rOPINIRole  (REQUIP ) tablet 3 mg, 3 mg, Oral, QHS  rosuvastatin  (CRESTOR ) tablet 10 mg, 10 mg, Oral, QHS  traZODone  (DESYREL ) tablet 200 mg, 200 mg, Oral, QHS    Continuous Infusions:  PRN and Respiratory Meds:ALPRAZolam  QHS PRN, cyclobenzaprine  Q6H PRN, HYDROcodone /acetaminophen  Q6H PRN, melatonin QHS PRN, ondansetron  Q6H PRN **OR** ondansetron  Q6H PRN, polyethylene glycol 3350  QDAY PRN, sennosides-docusate sodium  QDAY PRN        Objective                        Vital Signs: Last Filed                 Vital Signs: 24 Hour Range   BP: 147/66 (11/05 1852)  Temp: 37 ?C (98.6 ?F) (11/05 1852)  Pulse: 76 (11/05 1852)  Respirations: 18 PER MINUTE (11/05 1852)  SpO2: 100 % (11/05 1852)  O2 Device: None (Room air) (11/05 0952)  Height: 157.5 cm (5' 2.01) (11/05 1000) BP: (123-174)/(46-74)   Temp:  [36 ?C (96.8 ?F)-37 ?C (98.6 ?F)]   Pulse:  [76-88]   Respirations:  [16 PER MINUTE-18 PER MINUTE]   SpO2:  [94 %-100 %]   O2 Device:  None (Room air)     Vitals:    10/30/24 1622 11/01/24 1000   Weight: 56.2 kg (124 lb) 56.2 kg (123 lb 14.4 oz)         Intake/Output Summary:  (Last 24 hours)    Intake/Output Summary (Last 24 hours) at 11/01/2024 2038  Last data filed at 10/31/2024 2115  Gross per 24 hour   Intake 360 ml   Output --   Net 360 ml              Physical Exam  General:  Alert, cooperative, no distress, appears stated age  Head:  Normocephalic, without obvious abnormality, atraumatic  Eyes:  Conjunctivae/corneas clear.  PERRL, EOMs intact.    Lungs:  Clear to auscultation bilaterally  Chest wall:  No tenderness or deformity.  Heart:  systolic murmer   Abdomen:  Soft, non-tender.  Bowel sounds normal.  No masses.  No organomegaly.  Extremities: Extremities normal, atraumatic, no cyanosis or edema  Peripheral pulses   2+ and symmetric, all extremities       Lab Review  Pertinent labs reviewed       Point of Care Testing  (Last 24 hours)  Glucose: 91 (11/01/24 0727)    Radiology and other Diagnostics Review:    Pertinent radiology reviewed.     Daisia Slomski T Cherril Hett, MD   Pager [phone]

## 2024-11-02 ENCOUNTER — Encounter: Admit: 2024-11-02 | Discharge: 2024-11-02 | Payer: MEDICARE

## 2024-11-02 NOTE — Care Plan [600008]
 Problem: Discharge Planning  Goal: Participation in plan of care  Outcome: Goal Ongoing  Goal: Knowledge regarding plan of care  Outcome: Goal Ongoing  Goal: Prepared for discharge  Outcome: Goal Ongoing     Problem: High Fall Risk  Goal: High Fall Risk  Outcome: Goal Ongoing     Problem: Pain  Goal: Management of pain  Outcome: Goal Ongoing  Goal: Knowledge of pain management  Outcome: Goal Ongoing  Goal: Progress Toward Pain Management Goals  Outcome: Goal Ongoing     Problem: VTE, Risk of  Goal: Absence of venous thrombosis  Outcome: Goal Ongoing  Goal: Knowledge of warfarin regimen  Outcome: Goal Ongoing

## 2024-11-02 NOTE — Patient Refusal [[id number]]
 Patient/family declined:  Bed/chair alarm, W/in arms reach during toileting/showering, Ambulation, and Other: allergy band and telemetry monitoring .    Patient/family educated on importance of intervention to their safety/quality of care. Patient/family continues to decline care.    Reason Why Patient/Family declined:  .    Individualized safety and/or care plan implemented. If additional safety measures implemented, please list them.     Escalated to:  Unit coordinator/charge nurse        Patient is refusing fall bundle, allergy band, and telemetry monitoring.

## 2024-11-02 NOTE — Patient Refusal [[id number]]
 Patient/family declined:  Bed/chair alarm, W/in arms reach during toileting/showering, and Other: tele monitor, allergy band.    Patient/family educated on importance of intervention to their safety/quality of care. Patient/family continues to decline care.    Reason Why Patient/Family declined:  pt refused tele because she isn't here for anything heart related.    Individualized safety and/or care plan implemented. If additional safety measures implemented, please list them.     Escalated to:  Unit coordinator/charge nurse.

## 2024-11-02 NOTE — Progress Notes [1]
 Chaplain Note:    Admit Date: 10/30/2024     Reason for Visit:  Request by RN to visit with patient who was emotionally upset and crying.      Source of Purpose/Meaning:  Patient expressed the importance of independent living and caring for themselves. 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 Primary concern is their ability for independent living and not going to a nursing home for rehab or long term living.    Method(s) of Coping: Patient stated the importance of knowledge and research offers clarity for decision making. Patient shared they have difficulty managing emotions but has a few friends that offer emotional support.    Support System:  Patient stated their daughter lives in [place]  and there are few people within close proximity to their residence that could offer physical support.     Interventions/Plan: Chaplain offered reflective listening and conversation to help patient process their emotions. Patient was feeling better at the end of our conversation and was thankful for the visit and a shoulder to lean on.           Date/Time:                      User:                                      11/02/2024 7:36 AM Charlie Barks      PCU 9 PCU               The [facility] is available on Voalte or can be paged via the switchboard [phone]) for urgent and emergent needs.   The [facility]ual [facility] team responds to other requests within 24-hours when submitted as a Chaplain Consult in O[facility].

## 2024-11-02 NOTE — Progress Notes [1]
 General Progress Note    Name: Cheyenne Ayers        MRN: [id number]          DOB: [birth year]            Age: 78 y.o.  Admission Date: 10/30/2024       LOS: 3 days    Date of Service: 11/02/2024    Assessment/Plan:    #Falls  #Trembling  #Hx of possible TIA  -Reports trembling and minor falls x3 weeks, with a major fall happening this past Friday (10/31) where she was walking down the hall and fell forward onto her face w/o LOC; several small bruises are visible around the center of her face and on her nose  -Saw a neurologist in [place] for similar symptoms; all testing, including head imaging was negative, chalked up to possible TIA, prescribed aspirin  for secondary prevention - patient refuses to take it  -Polypharmacy may be contributing  -B12 and TSH normal  -EKG NSR w/ qTC prolongation  -CT head and C-spine normal  -MRI head w/o acute finding   - MRI c spine Prior C4-C6 ACDF with solid interbody fusion. 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 No obvious cord edema. Additional multilevel mild to moderate foraminal stenosis as above.   - Echo with EF 65% mild MS and mild AS  - orthostatic vitals negative   PLAN  - tele ordered. Pt not using at this time. She is willing to have long term monitoring at discharge  - follow on copper levels  - appreciate neurology assistance in evaluation. 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 Recommending consistent supervision at discharge  - discussed at length with patient.  Patient currently does not have anyone to provide supervision. She is also very clear  on not wanting to go to rehab or snf/long term care.   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 I discussed with her workups completed so far along with neurology evaluation. She is agreeable regarding additional workup as outpatient. Patient in process of finding someone to check on her/stay with her.   - patient is also very hesitant about changing her medications. Will discuss with her after geriatrics review      #Chronic LBP:  #Sciatica:  #Severe OA s/p bilateral knee, bilateral hip, right shoulder replacements:  - continue pta pain regimen   >PT/OT     #Asymptomatic pyuria/bacteriuria:  -Reports having been to an ED within the last few weeks and having a UTI, being on abx she cannot recall, and having not completed the course  -UA here negative for nitrites, + for leukocytes, no bacteria  -Denies dysuria, increased urinary frequency, or hematuria  PLAN:  >Continue monitoring for sx  >F/u reflex'd culture - no growth to date      #Bipolar II:  #Chronic Benzo Use:  #Restless leg syndrome  #Insomnia  PLAN:  >Continue PTA lamictal , citalopram , trazodone , requip , melatonin  >Continue xanax  qhs PRN  >patient does not want further adjustment of her medications as she has been stable on this regimen   >geriatrics consulted      # CKD 4-5  -Cr 3.37; GFR 14. Cr at osh has ranged from 3-4   -Patient reports procedure done already for peritoneal dialysis but has yet to start  PLAN:  > trend Cr  >Avoid nephrotoxic agents  >Bolus of fluid given in ED; reassess with  morning labs     #HTN  #HLD  #Aortic stenosis  #Mitral stenosis  PLAN:  >Held PTA lisinopril, amlodipine b/c patient reports she does not take them  >Continue PTA Rosuvastatin      #Anemia  -Hgb 9.6 on admission  PLAN:  >Patient says she does not take her iron  supplement  >Trend CBC     #Hiatal hernia  #Hx of PUD  -Reports hx of H. Pylori and small non-operative hiatal hernia  PLAN:  >Held PTA Pantoprazole  b/c patient says she does not take it  >Famotidine      #Chronic B12 deficiency  -Reports monthly injections  - b12 this admission 529      Diet: Renal  DVT PPx: hep subq  Code Status: FULL     Disposition: continue observation     Total Time Today was 60  minutes in the following activities: time spent in evaluating patient, discussing disposition plan with her and coordinating care     _______________________________________________________________________    Subjective   Cheyenne Ayers is a 78 y.o. female. No acute events overnight. No recurrence of symptoms during hospital stay. 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  She otherwise denies chest pain or sob.     9 pt review of systems done and negative except for what is noted above.          Medications  Scheduled Meds:citalopram  (CeleXA ) tablet 20 mg, 20 mg, Oral, QDAY  famotidine  (PEPCID ) tablet 20 mg, 20 mg, Oral, QDAY  heparin  (porcine) PF syringe 5,000 Units, 5,000 Units, Subcutaneous, Q8H  lamoTRIgine  (LaMICtal ) tablet 200 mg, 200 mg, Oral, QDAY  rOPINIRole  (REQUIP ) tablet 3 mg, 3 mg, Oral, QHS  rosuvastatin  (CRESTOR ) tablet 10 mg, 10 mg, Oral, QHS  traZODone  (DESYREL ) tablet 200 mg, 200 mg, Oral, QHS    Continuous Infusions:  PRN and Respiratory Meds:ALPRAZolam  QHS PRN, cyclobenzaprine  Q6H PRN, HYDROcodone /acetaminophen  Q6H PRN, melatonin QHS PRN, ondansetron  Q6H PRN **OR** ondansetron  Q6H PRN, polyethylene glycol 3350  QDAY PRN, sennosides-docusate sodium  QDAY PRN        Objective                        Vital Signs: Last Filed                 Vital Signs: 24 Hour Range   BP: 171/70 (11/06 0941)  Temp: 36.3 ?C (97.4 ?F) (11/06 9058)  Pulse: 100 (11/06 0941)  Respirations: 18 PER MINUTE (11/06 0941)  SpO2: 100 % (11/06 0941) BP: (123-171)/(63-74)   Temp:  [36 ?C (96.8 ?F)-37 ?C (98.6 ?F)]   Pulse:  [76-100]   Respirations:  [17 PER MINUTE-18 PER MINUTE]   SpO2:  [98 %-100 %]    Intensity Pain Scale (Self Report): 7 (11/02/24 9166) Vitals:    10/30/24 1622 11/01/24 1000   Weight: 56.2 kg (124 lb) 56.2 kg (123 lb 14.4 oz)         Intake/Output Summary:  (Last 24 hours)    Intake/Output Summary (Last 24 hours) at 11/02/2024 1235  Last data filed at 11/02/2024 0400  Gross per 24 hour   Intake 100 ml   Output --   Net 100 ml Physical Exam  General:  Alert, cooperative, no distress, appears stated age  Head:  Normocephalic, without obvious abnormality, atraumatic  Eyes:  Conjunctivae/corneas clear.  PERRL, EOMs intact.    Lungs:  Clear  to auscultation bilaterally  Chest wall:  No tenderness or deformity.  Heart:  systolic murmer   Abdomen:  Soft, non-tender.  Bowel sounds normal.  No masses.  No organomegaly.  Extremities: Extremities normal, atraumatic, no cyanosis or edema  Peripheral pulses   2+ and symmetric, all extremities       Lab Review  Pertinent labs reviewed       Point of Care Testing  (Last 24 hours)  Glucose: (!) 109 (11/02/24 9192)    Radiology and other Diagnostics Review:    Pertinent radiology reviewed.     Venisa Frampton T Adithya Difrancesco, MD   Pager [phone]

## 2024-11-02 NOTE — Progress Notes [1]
 Notified by staff members that patient was raising voice and slammed door on lab tech. This RN entered room to ask why patient is not agreeable to morning lab draw and patient states that the lab tech wasn't listening to her on how she wanted her lab drawn in her hand. Lab tech informed patient that he had been doing this job for 12 years and would like to take a look at all options. Patient told this RN that if she was a different skin color he wouldn't have spoken to her in that manner. This RN notified patient that talking in that manner will not be tolerated and is inappropriate.

## 2024-11-02 NOTE — Care Plan [600008]
 Problem: Discharge Planning  Goal: Participation in plan of care  Outcome: Goal Ongoing  Goal: Knowledge regarding plan of care  Outcome: Goal Ongoing  Goal: Prepared for discharge  Outcome: Goal Ongoing     Problem: High Fall Risk  Goal: High Fall Risk  Outcome: Goal Ongoing     Problem: Pain  Goal: Management of pain  Outcome: Goal Ongoing  Goal: Knowledge of pain management  Outcome: Goal Ongoing  Goal: Progress Toward Pain Management Goals  Outcome: Goal Ongoing     Problem: VTE, Risk of  Goal: Absence of venous thrombosis  Outcome: Goal Ongoing  Goal: Knowledge of warfarin regimen  Outcome: Goal Ongoing    BH51 JHFRAT NOTE    Admission Date: 10/30/2024  Length of Stay: LOS: 3 days      Fall Risk/JHFRAT Interventions and Education (charting when applicable)   a. Elimination Interventions: Communicate timing of laxatives/diuretics with assistive personnel to support proactive elimination needs.    b. Medications: Educate patient on medication side effects   c. Patient Care Equipment: Does not need assistance with patient care equipment when ambulating and Ensure environment is free of clutter and walkways are clear from tripping hazards   d. Mobility: N/A   e. Cognition: N/A   f. Risk for Moderate/Major Injury: Age: >65 yrs and Risk for fracture

## 2024-11-03 ENCOUNTER — Encounter: Admit: 2024-11-03 | Discharge: 2024-11-03 | Payer: MEDICARE

## 2024-11-03 ENCOUNTER — Inpatient Hospital Stay: Admit: 2024-11-03 | Discharge: 2024-11-03 | Payer: MEDICARE

## 2024-11-03 ENCOUNTER — Inpatient Hospital Stay: Admit: 2024-10-30 | Discharge: 2024-11-03 | Payer: MEDICARE

## 2024-11-03 DIAGNOSIS — F3181 Bipolar II disorder: Secondary | ICD-10-CM

## 2024-11-03 DIAGNOSIS — E785 Hyperlipidemia, unspecified: Secondary | ICD-10-CM

## 2024-11-03 DIAGNOSIS — Z87891 Personal history of nicotine dependence: Secondary | ICD-10-CM

## 2024-11-03 DIAGNOSIS — I080 Rheumatic disorders of both mitral and aortic valves: Secondary | ICD-10-CM

## 2024-11-03 DIAGNOSIS — Z8711 Personal history of peptic ulcer disease: Secondary | ICD-10-CM

## 2024-11-03 DIAGNOSIS — G8929 Other chronic pain: Secondary | ICD-10-CM

## 2024-11-03 DIAGNOSIS — K219 Gastro-esophageal reflux disease without esophagitis: Secondary | ICD-10-CM

## 2024-11-03 DIAGNOSIS — Z8619 Personal history of other infectious and parasitic diseases: Secondary | ICD-10-CM

## 2024-11-03 DIAGNOSIS — Z9049 Acquired absence of other specified parts of digestive tract: Secondary | ICD-10-CM

## 2024-11-03 DIAGNOSIS — M4802 Spinal stenosis, cervical region: Secondary | ICD-10-CM

## 2024-11-03 DIAGNOSIS — N184 Chronic kidney disease, stage 4 (severe): Secondary | ICD-10-CM

## 2024-11-03 DIAGNOSIS — Z96653 Presence of artificial knee joint, bilateral: Secondary | ICD-10-CM

## 2024-11-03 DIAGNOSIS — Z96643 Presence of artificial hip joint, bilateral: Secondary | ICD-10-CM

## 2024-11-03 DIAGNOSIS — M47812 Spondylosis without myelopathy or radiculopathy, cervical region: Secondary | ICD-10-CM

## 2024-11-03 DIAGNOSIS — D631 Anemia in chronic kidney disease: Secondary | ICD-10-CM

## 2024-11-03 DIAGNOSIS — Z556 Problems related to health literacy: Secondary | ICD-10-CM

## 2024-11-03 DIAGNOSIS — Z90710 Acquired absence of both cervix and uterus: Secondary | ICD-10-CM

## 2024-11-03 DIAGNOSIS — E538 Deficiency of other specified B group vitamins: Secondary | ICD-10-CM

## 2024-11-03 DIAGNOSIS — Z91148 Patient's other noncompliance with medication regimen for other reason: Secondary | ICD-10-CM

## 2024-11-03 DIAGNOSIS — Z981 Arthrodesis status: Secondary | ICD-10-CM

## 2024-11-03 DIAGNOSIS — G2581 Restless legs syndrome: Secondary | ICD-10-CM

## 2024-11-03 DIAGNOSIS — I129 Hypertensive chronic kidney disease with stage 1 through stage 4 chronic kidney disease, or unspecified chronic kidney disease: Secondary | ICD-10-CM

## 2024-11-03 DIAGNOSIS — Z9882 Breast implant status: Secondary | ICD-10-CM

## 2024-11-03 DIAGNOSIS — Z79899 Other long term (current) drug therapy: Secondary | ICD-10-CM

## 2024-11-03 DIAGNOSIS — G4700 Insomnia, unspecified: Secondary | ICD-10-CM

## 2024-11-03 DIAGNOSIS — Z96611 Presence of right artificial shoulder joint: Secondary | ICD-10-CM

## 2024-11-03 DIAGNOSIS — Z888 Allergy status to other drugs, medicaments and biological substances status: Secondary | ICD-10-CM

## 2024-11-03 DIAGNOSIS — G14 Postpolio syndrome: Secondary | ICD-10-CM

## 2024-11-03 MED ORDER — ROPINIROLE 1 MG PO TAB
1-3 mg | ORAL_TABLET | Freq: Every evening | ORAL | 0 refills | 30.00000 days | Status: DC
Start: 2024-11-03 — End: 2024-11-03
  Filled 2024-11-03: 30d supply, fill #0

## 2024-11-03 MED ORDER — CITALOPRAM 20 MG PO TAB
20 mg | ORAL_TABLET | Freq: Every day | ORAL | 0 refills | 90.00000 days | Status: DC
Start: 2024-11-03 — End: 2024-11-03
  Filled 2024-11-03: 90d supply, fill #0

## 2024-11-03 MED ORDER — FAMOTIDINE 20 MG PO TAB
20 mg | ORAL_TABLET | Freq: Every day | ORAL | 0 refills | 90.00000 days | Status: AC
Start: 2024-11-03 — End: ?

## 2024-11-03 MED ORDER — ROSUVASTATIN 10 MG PO TAB
10 mg | ORAL_TABLET | Freq: Every evening | ORAL | 0 refills | 90.00000 days | Status: AC
Start: 2024-11-03 — End: ?

## 2024-11-03 MED ORDER — ROPINIROLE 1 MG PO TAB
1-3 mg | ORAL_TABLET | Freq: Every evening | ORAL | 0 refills | 30.00000 days | Status: AC
Start: 2024-11-03 — End: ?

## 2024-11-03 MED ORDER — ROSUVASTATIN 10 MG PO TAB
10 mg | ORAL_TABLET | Freq: Every evening | ORAL | 0 refills | Status: CN
Start: 2024-11-03 — End: ?

## 2024-11-03 MED ORDER — FAMOTIDINE 20 MG PO TAB
20 mg | ORAL_TABLET | Freq: Every day | ORAL | 0 refills | 90.00000 days | Status: DC
Start: 2024-11-03 — End: 2024-11-03
  Filled 2024-11-03: 90d supply, fill #0

## 2024-11-03 MED ORDER — CYCLOBENZAPRINE 10 MG PO TAB
5 mg | ORAL_TABLET | Freq: Three times a day (TID) | ORAL | 0 refills | 30.00000 days | Status: DC | PRN
Start: 2024-11-03 — End: 2024-11-03

## 2024-11-03 MED ORDER — ROSUVASTATIN 10 MG PO TAB
10 mg | ORAL_TABLET | Freq: Every evening | ORAL | 0 refills | 90.00000 days | Status: DC
Start: 2024-11-03 — End: 2024-11-03
  Filled 2024-11-03: 90d supply, fill #0

## 2024-11-03 MED ORDER — CITALOPRAM 20 MG PO TAB
20 mg | ORAL_TABLET | Freq: Every day | ORAL | 0 refills | 90.00000 days | Status: AC
Start: 2024-11-03 — End: ?

## 2024-11-03 NOTE — Patient Refusal [[id number]]
 Patient/family declined:  Bed/chair alarm, W/in arms reach during toileting/showering, and Ambulation.    Patient/family educated on importance of intervention to their safety/quality of care. Patient/family continues to decline care.    Reason Why Patient/Family declined:  .    Individualized safety and/or care plan implemented. If additional safety measures implemented, please list them.     Escalated to:  Unit coordinator/charge nurse.      Patient refusing tele monitoring, heparin  shots, and allergy alert band.

## 2024-11-03 NOTE — Progress Notes [1]
 PHYSICAL THERAPY  PROGRESS NOTE          Name: Cheyenne Ayers   MRN: [id number]     DOB: 10-Oct-1946      Age: 78 y.o.  Admission Date: 10/30/2024     LOS: 4 days     Date of Service: 11/03/2024        Mobility  Patient Turn/Position: Self  Mobility Level [facility] Highest Level of Mobility (JH-HLM): Walk 250 feet or more  Distance Walked (feet): 250 ft  Level of Assistance: Stand by assistance  Assistive Device: None  Activity Limited By: No limitations    Subjective  Reason for Admission and Past Medical Hx: 78 year old female with a significant medical history including stage IV/V chronic kidney disease, anemia of chronic disease, hypertension, hyperlipidemia, bipolar disorder managed with chronic benzodiazepines, chronic pain requiring long-term opioids and muscle relaxants, and severe osteoarthritis with multiple joint replacements. She presented to the hospital requesting neurological evaluation due to a three-week history of trembling and recurrent falls  Mental / Cognitive: Alert;Cooperative;Follows commands  Pain: No complaint of pain  Pain Interventions: Patient agrees to participate in therapy with current pain level;Patient assisted into position of comfort    Home Living Situation  Lives With: Alone;Receives assistance from friends/neighbors  Type of Home: House  Entry Stairs: 1  In-Home Stairs: Able to live on main level (upstairs but pt does not have to access)  Bathroom Setup: Walk in shower  Patient Owned Equipment: Walker with wheels    Prior Level of Function  Level Of Independence: Independent with ADL and household mobility without device  Comments: Patient reports she cannot ambulate long distances due to pain. Will use electric cart at [employer]  History of Falls in Past 3 Months: Yes  Comments: Reports a fall which led to this admission - reports she fell forwards but had no warning signs prior (no dizziness, no knee buckling, etc). Reports she has had several other small falls leading up to this admission    Bed Mobility/Transfer  Bed Mobility: Supine to Sit: Independent;Bed Flat  Comments: Patient sitting on EOB at end of session    Transfer Type: Sit to/from Stand  Transfer: Assistance Level: To/From;Bed;Standby Assist  Transfer: Assistive Device: None  Transfers: Type Of Assistance: For Safety Considerations    End Of Activity Status: In Bed;Nursing Notified;Instructed Patient to Request Assist with Mobility;Instructed Patient to Use Call Light    Comments: Patient declines to use gait belt    Gait  Gait Distance: 250 feet  Gait: Assistance Level: Standby Assist  Gait: Assistive Device: None  Gait: Descriptors: Pace: Normal;Swing-Through Gait;No balance loss;Variable step length    Activity Limited By: Complaint of Fatigue;Patient Choice    Education  Persons Educated: Patient  Patient Barriers To Learning: None Noted  Teaching Methods: Verbal Instruction  Patient Response: Verbalized Understanding  Topics: Plan/Goals of PT Interventions;Use of Assistive Device/Orthosis;Mobility Progression;Up with Assist Only;Importance of Increasing Activity;Ambulate With Nursing;Recommend Continued Therapy;Therapy Schedule    Assessment/Progress  Impaired Mobility Due To: Decreased Activity Tolerance;Medical Status Limitation  Assessment/Progress: Should Improve w/ Continued PT    AM-PAC 6 Clicks Basic Mobility Inpatient  Turning from your back to your side while in a flat bed without using bed rails: None  Moving from lying on your back to sitting on the side of a flat bed without using bedrails : None  Moving to and from a bed to a chair (including a wheelchair): None  Standing up from a chair using  your arms (e.g. wheelchair, or bedside chair): None  To walk in hospital room: A Little  Climbing 3-5 steps with a railing: A Little  Basic Mobility Inpatient Raw Score: 22  Standardized (T-scale) Score: 47.4  Mobility Goal [facility]: JH-HLM 7 Walk >= 25 ft    Goals  Goal Formulation: With Patient  Time For Goal Achievement: 3 days, To, 5 days  Patient Will Go Supine To/From Sit: Independently, Partly Met, Ongoing  Patient Will Transfer Bed/Chair: Independently, Ongoing  Patient Will Transfer Sit to Stand: Independently, Ongoing  Patient Will Ambulate: Greater than 200 Feet, Independently, Ongoing    Plan  Treatment Interventions: Mobility training;Endurance training  Plan Frequency: 1-2 Days per Week  PT Plan for Next Visit: Balance assessment, trial stairs, progress gait    PT Discharge Recommendations  Recommendation: Home with consistent supervision/assistance (Consider inpatient if consistent assistance is not available due to recurring falls)  Recommendation for Therapy Post Discharge: Outpatient  Patient Currently Requires Supervision For: Mobility  Patient Currently Requires Equipment: Owns what is needed      Therapist: Damien Jacobsen, PT  Date: 11/03/2024

## 2024-11-03 NOTE — Progress Notes [1]
 Cheyenne Ayers discharged on 11/03/2024.   SABRA  Discharge instructions reviewed with patient.  Valuables returned:   ADL Belongings at Bedside: Eyeglasses/contacts.  Home medications:    .  Functional assessment at discharge complete: Yes .

## 2024-11-03 NOTE — Case Mgmt DC Plan [600024]
 CMA Note:       Received a request from [employer], RNCM to deliver CM cab pass for patient to dc to address on file to bedside. FREDRIK Ernst delivered to charge nurse.     Verdie Edison  Case Management Assistant  For additional assistance please contact SWCM *

## 2024-11-03 NOTE — Discharge Summary [5]
 Discharge Summary      Name: Cheyenne Ayers  Medical Record Number: [id number]        Account Number:  [id number]  Date Of Birth:  24-Mar-1946                         Age:  78 y.o.  Admit date:  10/30/2024                     Discharge date: 11/03/2024      Discharge Attending:  Mychael Smock T Indy Kuck, MD  Discharge Summary Completed By: Kimberely Mccannon T Martice Doty, MD    Service: Med Private R [phone]    Reason for hospitalization:  Fall     Primary Discharge Diagnosis:   Same as Above    Hospital Diagnoses:  Hospital Problems        Active Problems    * (Principal) Loss of consciousness (CMS-HCC)    Recurrent falls     Present on Admission:   Loss of consciousness (CMS-HCC)        Significant Past Medical History        Bipolar disorder (CMS-HCC)  Carotid stenosis  Chest pressure  Chronic back pain  Chronic pain  GERD (gastroesophageal reflux disease)  HTN (hypertension)  Hyperlipidemia  Migraine  Non compliance w medication regimen  Post-polio syndrome (CMS-HCC)    Allergies   Calcium channel blocking agent diltiazem analogues, Nitrous oxide, Sulfa dyne, Ambien [zolpidem], Halcion [triazolam], Lincocin [lincomycin], and Stadol [butorphanol tartrate]    Brief Hospital Course   Cheyenne Ayers is a 78 year old female with a significant medical history including stage IV/V chronic kidney disease, anemia of chronic disease, hypertension, hyperlipidemia, bipolar disorder managed with chronic benzodiazepines, chronic pain requiring long-term opioids and muscle relaxants, and severe osteoarthritis with multiple joint replacements. She presented to the hospital requesting neurological evaluation due to a three-week history of trembling and recurrent falls.  She saw a neurologist in [place] for similar symptoms; all testing, including head imaging was negative her symptoms were thought to be. possible TIA, prescribed aspirin  for secondary prevention - patient refuses to take it.  Evaluation included B12 and TSH which were normal. EKG NSR w/ qTC prolongation. CT head and C-spine normal. MRI head w/o acute finding. MRI c spine Prior C4-C6 ACDF with solid interbody fusion. 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 No obvious cord edema. Additional multilevel mild to moderate foraminal stenosis as above. Echo with EF 65% mild MS and mild AS. Orthostatic vitals negative.  She only were agreeable to using telemetry for short of time and no events were observed.  She was agreeable to long-term cardiac monitor which was prescribed at discharge.  Neurology team evaluated as well as geriatrics teams.  Her falls are likely multifactorial given her spinal stenosis and polypharmacy.  Patient was very hesitant to make any changes to her pain regimen.  We did make adjustments to her citalopram  and atorvastatin based on her kidney function.  She was evaluated by PT/OT who recommended supervision at discharge.  Patient lives on her own.  She stated that she will not go to rehab or skilled nursing facility as she does not see the need for it.  She also stated she would never go to a long-term care facility.  She asked to be discharged home and she will be taking proper precautions.   She will follow-up with her cardiologist and PCP for  further care.  Additional follow-up was requested with [facility] however she continues to follow with the previous neurologist      Day of discharge exam notable for: She was alert and oriented.  PERRL.  Extraocular motion intact.  Her lungs are clear to auscultation.  She has a systolic murmur otherwise heart was regular rate and rhythm.  Her abdomen was soft and nontender.  She had no lower extremity edema,.     Items Needing Follow Up   Pending items or areas that need to be addressed at follow up: none    Pending Labs and Follow Up Radiology    Pending labs and/or radiology review at this time of discharge are listed below: Please note- any labs with collected status will not have a result; if this area is blank, there are no items for review.     CHEST 2 VIEWS    Medications        Medication List        CONTINUE taking these medications      ALPRAZolam  2 mg tablet  Commonly known as: XANAX   Dose: 2 mg  Take one tablet by mouth twice daily.  Refills: 0     fluticasone propionate 50 mcg/actuation nasal spray, suspension  Commonly known as: FLONASE  Dose: 2 spray  Apply two sprays to each nostril as directed at bedtime daily.  Refills: 0     folic acid 1 mg tablet  Commonly known as: FOLVITE  Dose: 1 mg  Take one tablet by mouth at bedtime daily.  Refills: 0     HYDROcodone /acetaminophen  10/325 mg tablet  Commonly known as: NORCO  Dose: 1 tablet  Take one tablet by mouth every 6 hours as needed for Pain.  Refills: 0     lamoTRIgine  200 mg tablet  Commonly known as: LaMICtal   Dose: 200 mg  Take one tablet by mouth at bedtime daily.  Refills: 0     traZODone  100 mg tablet  Commonly known as: DESYREL   Dose: 200 mg  Take two tablets by mouth at bedtime daily.  Refills: 0            STOP taking these medications      BENADRYL ALLERGY PO     citalopram  40 mg tablet  Commonly known as: CeleXA      cyclobenzaprine  10 mg tablet  Commonly known as: FLEXERIL      famotidine  20 mg tablet  Commonly known as: PEPCID      fexofenadine 180 mg tablet  Commonly known as: ALLEGRA     rOPINIRole  1 mg tablet  Commonly known as: REQUIP      rosuvastatin  40 mg tablet  Commonly known as: CRESTOR               Return Appointments and Scheduled Appointments   No appointment scheduled    Consults, Procedures, Diagnostics, Micro, Pathology   Consults: Neurology, geriatrics   Surgical Procedures & Dates: None  Significant Diagnostic Studies, Micro and Procedures: noted in brief hospital course  Significant Pathology: noted in brief hospital course                       Discharge Disposition, Condition   Patient Disposition: Home or Self Care [01]  Condition at Discharge: Stable    Code Status   Full Code    Patient Instructions Activity       Activity as Tolerated   As directed      It is  important to keep increasing your activity level after you leave the hospital.  Moving around can help prevent blood clots, lung infection (pneumonia) and other problems.  Gradually increasing the number of times you are up moving around will help you return to your normal activity level more quickly.  Continue to increase the number of times you are up to the chair and walking daily to return to your normal activity level. Begin to work toward your normal activity level at discharge          Diet       Cardiac Diet   As directed      Limiting unhealthy fats and cholesterol is the most important step you can take in reducing your risk for cardiovascular disease.  Unhealthy fats include saturated and trans fats.  Monitor your sodium and cholesterol intake.  Restrict your sodium to 2g (grams) or 2000mg  (milligrams) daily, and your cholesterol to 200mg  daily.    If you have questions regarding your diet at home, you may contact a dietitian at [phone].             Discharge education provided to patient.    Additional Orders: Case Management, Supplies, Home Health     Home Health/DME       None            Discharge Attending Time: I, the attending, spent greater than 30 minutes in counseling pt, coordinating discharge care, placing discharge orders and complete discharge summary.    Signed:  Drucilla Cumber T Lawanda Holzheimer, MD  11/03/2024      cc:  Primary Care Physician:  Kyung Goodell         Referring physicians:  Self, Referral   Additional provider(s):        Did we miss something? If additional records are needed, please fax a request on office letterhead to [phone]. Please include the patient's name, date of birth, fax number and type of information needed. Additional request can be made by email at [email or web address]. For general questions of information about electronic records sharing, call [phone].

## 2024-11-03 NOTE — Progress Notes [1]
 Ambulatory (External) Cardiac Monitor Enrollment Record     Placement Location: Inpatient  Vendor: [employer] ([employer])  Mobile Cardiac Telemetry (MCOT/MCT)?: No  Duration of Monitor (in days): 14  Monitor Diagnosis: Hypertension (I10)  Secondary Monitor Diagnosis: Assess Medical Therapy for Aortic Valve Stenosis (I35.0)  Ordering Provider: ZELALEM, PENIEL T ([id number])  AMB Monitor Serial Number: [id number]  No data recordedNo data recordedNo data recorded    Start Time and Date: 11/03/24 12:47 PM   Patient Name: Cheyenne Ayers  DOB: [birth year]  MRN: [id number]  Sex: female  Mobile Phone Number: [phone] (mobile)  Home Phone Number: [phone]  Patient Address: [address] [place] [address]  Insurance Coverage: MEDICARE PART A AND B  Insurance ID: [id number]  Insurance Group #:   Insurance Subscriber: [employer] KEANE LEEROY  Implanted Cardiac Device Information: No results found for: [id number]      Patient instructed to contact company phone number on the monitor box with questions regarding billing, placement, troubleshooting.     Kyliee Ortego    ____________________________________________________________    Clinic Staff:    Complete additional steps for documentation double check/Co-Sign.  In Follow-up, send chart upon closing encounter to P [facility] AMBULATORY MONITORS    [facility] Ambulatory Monitoring Team:  Schedule on appropriate template and check-in.   Clinic Placement Schedule on clinic location [facility] schedule   Home Enrollment Schedule on Home Enrollment schedule ([facility])   Given to patient in clinic for self-placement Schedule on Home Enrollment schedule ([facility])   Inpatient Schedule on  [facility] AM[facility]ULATORY MONITORING template   2. Please enroll with appropriate vendor.

## 2024-11-03 NOTE — Care Plan [600008]
 Problem: Discharge Planning  Goal: Participation in plan of care  Outcome: Goal Achieved  Goal: Knowledge regarding plan of care  Outcome: Goal Achieved  Goal: Prepared for discharge  Outcome: Goal Achieved     Problem: High Fall Risk  Goal: High Fall Risk  Outcome: Goal Achieved     Problem: Pain  Goal: Management of pain  Outcome: Goal Achieved  Goal: Knowledge of pain management  Outcome: Goal Achieved  Goal: Progress Toward Pain Management Goals  Outcome: Goal Achieved     Problem: VTE, Risk of  Goal: Absence of venous thrombosis  Outcome: Goal Achieved  Goal: Knowledge of warfarin regimen  Outcome: Goal Achieved

## 2024-12-12 ENCOUNTER — Encounter: Admit: 2024-12-12 | Discharge: 2024-12-12 | Payer: MEDICARE

## 2025-01-29 ENCOUNTER — Encounter: Admit: 2025-01-29 | Discharge: 2025-01-29 | Payer: MEDICARE

## 2025-01-29 NOTE — Progress Notes [1]
 CXR dated 10/31/2024 sent to PCP on file. Report recommends a CT Chest.
